# Patient Record
Sex: Male | Born: 1940 | Race: Black or African American | Hispanic: No | Marital: Single
Health system: Southern US, Community
[De-identification: ages and names within clinical notes are randomized; demographics above are authoritative.]

## PROBLEM LIST (undated history)

## (undated) DIAGNOSIS — K635 Polyp of colon: Secondary | ICD-10-CM

## (undated) DIAGNOSIS — T7840XA Allergy, unspecified, initial encounter: Secondary | ICD-10-CM

## (undated) DIAGNOSIS — L281 Prurigo nodularis: Secondary | ICD-10-CM

## (undated) DIAGNOSIS — I6529 Occlusion and stenosis of unspecified carotid artery: Secondary | ICD-10-CM

## (undated) DIAGNOSIS — N400 Enlarged prostate without lower urinary tract symptoms: Secondary | ICD-10-CM

## (undated) DIAGNOSIS — I4510 Unspecified right bundle-branch block: Secondary | ICD-10-CM

## (undated) DIAGNOSIS — K219 Gastro-esophageal reflux disease without esophagitis: Secondary | ICD-10-CM

## (undated) DIAGNOSIS — D649 Anemia, unspecified: Secondary | ICD-10-CM

## (undated) DIAGNOSIS — J45909 Unspecified asthma, uncomplicated: Secondary | ICD-10-CM

## (undated) DIAGNOSIS — D472 Monoclonal gammopathy: Secondary | ICD-10-CM

## (undated) HISTORY — DX: Anemia, unspecified: D64.9

## (undated) HISTORY — DX: Gastro-esophageal reflux disease without esophagitis: K21.9

## (undated) HISTORY — DX: Unspecified asthma, uncomplicated: J45.909

## (undated) HISTORY — DX: Polyp of colon: K63.5

## (undated) HISTORY — PX: TONSILLECTOMY: SUR1361

## (undated) HISTORY — DX: Allergy, unspecified, initial encounter: T78.40XA

---

## 2005-01-09 ENCOUNTER — Ambulatory Visit: Payer: Self-pay | Admitting: Gastroenterology

## 2005-01-17 ENCOUNTER — Ambulatory Visit: Payer: Self-pay | Admitting: Gastroenterology

## 2009-10-09 ENCOUNTER — Encounter (INDEPENDENT_AMBULATORY_CARE_PROVIDER_SITE_OTHER): Payer: Self-pay | Admitting: *Deleted

## 2010-01-01 ENCOUNTER — Encounter (INDEPENDENT_AMBULATORY_CARE_PROVIDER_SITE_OTHER): Payer: Self-pay | Admitting: *Deleted

## 2010-01-10 ENCOUNTER — Encounter (INDEPENDENT_AMBULATORY_CARE_PROVIDER_SITE_OTHER): Payer: Self-pay | Admitting: *Deleted

## 2010-01-11 ENCOUNTER — Ambulatory Visit: Payer: Self-pay | Admitting: Gastroenterology

## 2010-01-18 ENCOUNTER — Ambulatory Visit: Payer: Self-pay | Admitting: Gastroenterology

## 2010-11-26 NOTE — Miscellaneous (Signed)
Summary: previsit/rm  Clinical Lists Changes  Medications: Added new medication of MOVIPREP 100 GM  SOLR (PEG-KCL-NACL-NASULF-NA ASC-C) As per prep instructions. - Signed Rx of MOVIPREP 100 GM  SOLR (PEG-KCL-NACL-NASULF-NA ASC-C) As per prep instructions.;  #1 x 0;  Signed;  Entered by: Sherren Kerns RN;  Authorized by: Mardella Layman MD [facility];  Method used: Print then Give to Patient Observations: Added new observation of NKA: T (01/11/2010 10:29)    Prescriptions: MOVIPREP 100 GM  SOLR (PEG-KCL-NACL-NASULF-NA ASC-C) As per prep instructions.  #1 x 0   Entered by:   Sherren Kerns RN   Authorized by:   Mardella Layman MD [facility]   Signed by:   Sherren Kerns RN on 01/11/2010   Method used:   Print then Give to Patient   RxID:   [id number]

## 2010-11-26 NOTE — Letter (Signed)
Summary: Moviprep Instructions  [facility]  [address]   [place], [place] [address]   Phone: [phone]  Fax: [phone]       Adam Flores    1941/05/08    MRN: [id number]        Procedure Day Dorna Bloom: 01-18-10, Friday     Arrival Time: 10:00 a.m.      Procedure Time: 11:00 a.m.     Location of Procedure:                     x  [facility] (4th Floor)  PREPARATION FOR COLONOSCOPY WITH MOVIPREP   Starting 5 days prior to your procedure 01-13-10  do not eat nuts, seeds, popcorn, corn, beans, peas,  salads, or any raw vegetables.  Do not take any fiber supplements (e.g. Metamucil, Citrucel, and Benefiber).  THE DAY BEFORE YOUR PROCEDURE         DATE: 01-17-10   DAY: Thursday  1.  Drink clear liquids the entire day-NO SOLID FOOD  2.  Do not drink anything colored red or purple.  Avoid juices with pulp.  No orange juice.  3.  Drink at least 64 oz. (8 glasses) of fluid/clear liquids during the day to prevent dehydration and help the prep work efficiently.  CLEAR LIQUIDS INCLUDE: Water Jello Ice Popsicles Tea (sugar ok, no milk/cream) Powdered fruit flavored drinks Coffee (sugar ok, no milk/cream) Gatorade Juice: apple, white grape, white cranberry  Lemonade Clear bullion, consomm, broth Carbonated beverages (any kind) Strained chicken noodle soup Hard Candy                             4.  In the morning, mix first dose of MoviPrep solution:    Empty 1 Pouch A and 1 Pouch B into the disposable container    Add lukewarm drinking water to the top line of the container. Mix to dissolve    Refrigerate (mixed solution should be used within 24 hrs)  5.  Begin drinking the prep at 5:00 p.m. The MoviPrep container is divided by 4 marks.   Every 15 minutes drink the solution down to the next mark (approximately 8 oz) until the full liter is complete.   6.  Follow completed prep with 16 oz of clear liquid of your choice (Nothing red or  purple).  Continue to drink clear liquids until bedtime.  7.  Before going to bed, mix second dose of MoviPrep solution:    Empty 1 Pouch A and 1 Pouch B into the disposable container    Add lukewarm drinking water to the top line of the container. Mix to dissolve       Refrigerate THE DAY OF YOUR PROCEDURE      DATE: 01-18-10   DAY: Friday  Beginning at 6:00 a.m. (5 hours before procedure):         1. Every 15 minutes, drink the solution down to the next mark (approx 8 oz) until the full liter is complete.  2. Follow completed prep with 16 oz. of clear liquid of your choice.    3. You may drink clear liquids until 9:00 a.m.   (2 HOURS BEFORE PROCEDURE).   MEDICATION INSTRUCTIONS  Unless otherwise instructed, you should take regular prescription medications with a small sip of water   as early as possible the morning of your procedure.   Additional medication instructions:  n/a  OTHER INSTRUCTIONS  You will need a responsible adult at least 69 years of age to accompany you and drive you home.   This person must remain in the waiting room during your procedure.  Wear loose fitting clothing that is easily removed.  Leave jewelry and other valuables at home.  However, you may wish to bring a book to read or  an iPod/MP3 player to listen to music as you wait for your procedure to start.  Remove all body piercing jewelry and leave at home.  Total time from sign-in until discharge is approximately 2-3 hours.  You should go home directly after your procedure and rest.  You can resume normal activities the  day after your procedure.  The day of your procedure you should not:   Drive   Make legal decisions   Operate machinery   Drink alcohol   Return to work  You will receive specific instructions about eating, activities and medications before you leave.    The above instructions have been reviewed and explained to me by  Sherren Kerns RN  January 11, 2010  10:49 AM      I fully understand and can verbalize these instructions _____________________________ Date _________

## 2010-11-26 NOTE — Procedures (Signed)
Summary: Colonoscopy  Patient: Adam Flores Note: All result statuses are Final unless otherwise noted.  Tests: (1) Colonoscopy (COL)   COL Colonoscopy           DONE     [facility]     [address]     [place], [place]  [address]           COLONOSCOPY PROCEDURE REPORT           PATIENT:  Adam Flores, Adam Flores  MR#:  [id number]     BIRTHDATE:  January 21, 1941, 68 yrs. old  GENDER:  male     ENDOSCOPIST:  Vania Rea. Jarold Motto, MD, [facility]     REF. BY:     PROCEDURE DATE:  01/18/2010     PROCEDURE:  Average-risk screening colonoscopy     G0121     ASA CLASS:  Class II     INDICATIONS:  Routine Risk Screening, history of polyps     MEDICATIONS:   Fentanyl 50 mcg IV, Versed 4 mg IV           DESCRIPTION OF PROCEDURE:   After the risks benefits and     alternatives of the procedure were thoroughly explained, informed     consent was obtained.  Digital rectal exam was performed and     revealed no abnormalities.   The LB CF-[id number] endoscope     was introduced through the anus and advanced to the cecum, which     was identified by both the appendix and ileocecal valve, without     limitations.  The quality of the prep was excellent, using     MoviPrep.  The instrument was then slowly withdrawn as the colon     was fully examined.     <<PROCEDUREIMAGES>>           FINDINGS:  No polyps or cancers were seen.  This was otherwise a     normal examination of the colon.  Internal hemorrhoids were found.     Retroflexed views in the rectum revealed no abnormalities.    The     scope was then withdrawn from the patient and the procedure     completed.           COMPLICATIONS:  None     ENDOSCOPIC IMPRESSION:     1) No polyps or cancers     2) Otherwise normal examination     3) Internal hemorrhoids     RECOMMENDATIONS:     1) Follow up colonoscopy in 5 years     REPEAT EXAM:  No           ______________________________     Vania Rea. Jarold Motto, MD, [facility]           CC:          n.     eSIGNED:   Vania Rea. Patterson at 01/18/2010 11:45 AM           Jenita Seashore, [id number]  Note: An exclamation mark (!) indicates a result that was not dispersed into the flowsheet. Document Creation Date: 01/18/2010 12:51 PM _______________________________________________________________________  (1) Order result status: Final Collection or observation date-time: 01/18/2010 11:40 Requested date-time:  Receipt date-time:  Reported date-time:  Referring Physician:   Ordering Physician: Sheryn Bison [phone]) Specimen Source:  Source: Launa Grill Order Number: [id number] Lab site:   Appended Document: Colonoscopy    Clinical Lists Changes  Observations: Added new observation of COLONNXTDUE: 12/2014 (01/18/2010 13:38)

## 2010-11-26 NOTE — Letter (Signed)
Summary: Previsit letter  [facility]  [address] [place], [place] [address]   Phone: [phone]  Fax: [phone]       01/01/2010 MRN: [id number]  Adam Flores [address] [place], [place]  [address]  Dear [name],  Welcome to the Gastroenterology Division at [facility].    You are scheduled to see a nurse for your pre-procedure visit on 01/11/2010 at 10:30AM on the 3rd floor at [facility], [address]  We ask that you try to arrive at our office 15 minutes prior to your appointment time to allow for check-in.  Your nurse visit will consist of discussing your medical and surgical history, your immediate family medical history, and your medications.    Please bring a complete list of all your medications or, if you prefer, bring the medication bottles and we will list them.  We will need to be aware of both prescribed and over the counter drugs.  We will need to know exact dosage information as well.  If you are on blood thinners (Coumadin, Plavix, Aggrenox, Ticlid, etc.) please call our office today/prior to your appointment, as we need to consult with your physician about holding your medication.   Please be prepared to read and sign documents such as consent forms, a financial agreement, and acknowledgement forms.  If necessary, and with your consent, a friend or relative is welcome to sit-in on the nurse visit with you.  Please bring your insurance card so that we may make a copy of it.  If your insurance requires a referral to see a specialist, please bring your referral form from your primary care physician.  No co-pay is required for this nurse visit.     If you cannot keep your appointment, please call [phone] to cancel or reschedule prior to your appointment date.  This allows [place] the opportunity to schedule an appointment for another patient in need of care.    Thank you for choosing [facility] for your medical  needs.  We appreciate the opportunity to care for you.  Please visit [place] at our website  to learn more about our practice.                     Sincerely.                                                                                                                   The Gastroenterology Division

## 2014-07-13 ENCOUNTER — Encounter: Payer: Self-pay | Admitting: Gastroenterology

## 2015-02-06 ENCOUNTER — Encounter: Payer: Self-pay | Admitting: Gastroenterology

## 2016-05-02 ENCOUNTER — Ambulatory Visit (INDEPENDENT_AMBULATORY_CARE_PROVIDER_SITE_OTHER): Payer: Federal, State, Local not specified - PPO | Admitting: Gastroenterology

## 2016-05-02 ENCOUNTER — Encounter: Payer: Self-pay | Admitting: Gastroenterology

## 2016-05-02 VITALS — BP 116/80 | HR 72 | Ht 66.0 in | Wt 142.4 lb

## 2016-05-02 DIAGNOSIS — Z86010 Personal history of colonic polyps: Secondary | ICD-10-CM

## 2016-05-02 DIAGNOSIS — K625 Hemorrhage of anus and rectum: Secondary | ICD-10-CM | POA: Diagnosis not present

## 2016-05-02 MED ORDER — NA SULFATE-K SULFATE-MG SULF 17.5-3.13-1.6 GM/177ML PO SOLN: 1.0000 | Freq: Once | ORAL | Status: DC

## 2016-05-02 NOTE — Patient Instructions (Signed)

## 2016-05-02 NOTE — Progress Notes (Signed)
HPI: [name] is a 75 year old African-American male who returns to our clinic today for consultation of a colonoscopy. Per review of our records previous colonoscopies were as below:   Colonoscopy Dr. Verl Blalock 2001 for "family history of polyps" found internal hemorrhoids and a 5 millimeter polyp. I cannot tell if he removed this but there are no pathology reports available at the time of this visit. Colonoscopy Dr. Verl Blalock 2006 for history of adenomatous polyps. No polyps were found. He recommended repeat colonoscopy at five-year interval Colonoscopy Dr. Verl Blalock 2011 for history of adenomatous polyps. No polyps were found. He recommended repeat colonoscopy again at five-year interval.  Today, the patient tells me that he is experiencing bright red blood per rectum which he sees on the toilet paper after wiping 3-4 times per year, typically when he is constipated or straining to have a bowel movement. He also describes that his history of polyps makes him "nervous and he doesn't want to mess around".   He does describe some very infrequent reflux for which he takes Tums, but denies other GI complaints.  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  Patient denies nausea, vomiting, heartburn, abdominal pain, change in bowel habits, melena or weight loss.   Past Medical History  Diagnosis Date  . Asthma   . Colon polyps     Past Surgical History  Procedure Laterality Date  . Tonsillectomy      Current Outpatient Prescriptions  Medication Sig Dispense Refill  . albuterol (PROVENTIL HFA) 108 (90 Base) MCG/ACT inhaler Take 1 puff by mouth as needed.    Marland Kitchen aspirin (GOODSENSE ASPIRIN) 81 MG chewable tablet Chew 1 tablet by mouth. Mon, Wed, Fri    . budesonide-formoterol (SYMBICORT) 80-4.5 MCG/ACT inhaler Inhale 1 puff into the lungs daily.    . clobetasol ointment (TEMOVATE) [id number] % Apply 1 application  topically as needed.    . methotrexate (RHEUMATREX) 2.5 MG tablet Take 1 tablet by mouth once a week.    . Multiple Vitamins-Minerals (MULTIVITAL PO) Take 1 tablet by mouth daily.    . Omega-3 1000 MG CAPS Take 1 tablet by mouth daily.    . folic acid (FOLVITE) 1 MG tablet Take 1 tablet by mouth daily.     No current facility-administered medications for this visit.    Allergies as of 05/02/2016  . (No Known Allergies)    Family History  Problem Relation Age of Onset  . Stroke Father   . Hypertension Sister     Social History   Social History  . Marital Status: Married    Spouse Name: N/A  . Number of Children: 0  . Years of Education: N/A   Occupational History  . radiologist    Social History Main Topics  . Smoking status: Never Smoker   . Smokeless tobacco: Never Used  . Alcohol Use: 0.0 oz/week    0 Standard drinks or equivalent per week     Comment: social  . Drug Use: No  . Sexual Activity: Not on file   Other Topics Concern  . Not on file   Social History Narrative  . No narrative on file    Review of Systems:    Constitutional: No weight loss, fever, chills, weakness or fatigue HEENT: Eyes: No Change in vision               Ears, Nose, Throat:  No Change in hearing Cardiovascular:  No chest painOr palpitations   Respiratory: No SOB Gastrointestinal: See HPI and otherwise negative Genitourinary: No Change in urinary frequency Neurological: No Dizziness or syncope Musculoskeletal: NoNew muscle or back pain Hematologic: Rectal bleeding as in history of present illness Psychiatric: No history of depression or anxiety    Physical Exam:  Vital signs: BP 116/80 mmHg  Pulse 72  Ht 5\' 6"  (1.676 m)  Wt 142 lb 6 oz (64.581 kg)  BMI 22.99 kg/m2  General:   Pleasant African-American male appears to be in NAD, Well developed, Well nourished, alert and cooperative Head:  Normocephalic and atraumatic. Eyes:   PEERL, EOMI. No icterus. Conjunctiva  pink. Ears:  Normal auditory acuity. Neck:  Supple Throat: Oral cavity and pharynx without inflammation, swelling or lesion. Lungs: Respirations even and unlabored. Lungs clear to auscultation bilaterally.   No wheezes, crackles, or rhonchi.  Heart: Normal S1, S2. No MRG. Regular rate and rhythm. No peripheral edema, cyanosis or pallor.  Abdomen:  Soft, nondistended, nontender. No rebound or guarding. Normal bowel sounds. No appreciable masses or hepatomegaly. Rectal:  Not performed.  Msk:  Symmetrical without gross deformities. Extremities:  Without edema, no deformity or joint abnormality.  Neurologic:  Alert and  oriented x4;  grossly normal neurologically.  Skin:   Dry and intact without significant lesions or rashes. Psychiatric: Oriented to person, place and time. Demonstrates good judgement and reason without abnormal affect or behaviors.  Assessment: 1. Rectal bleeding: Patient describes 3-4 episodes a year of bright red blood on the toilet paper; consider hemorrhoids versus AVM versus diverticular bleed versus cancer 2. History of polyps: See history of present illness, first colonoscopy in 2001 had finding of small polyp, pathology unavailable  Plan: 1. Recommend the patient proceed with colonoscopy for further evaluation of rectal bleeding. Risks, benefits, limitations and alternatives of this procedure were discussed and the patient agrees to proceed. 2. Continue Tums when necessary for infrequent reflux symptoms 3. Patient to follow in clinic as directed after time of colonoscopy  Ellouise Newer, PA-C [facility] 05/02/2016, 9:43 AM   ________________________________________________________________________  Velora Heckler GI MD note:  I personally examined the patient, reviewed the data and agree with the assessment and plan described above.  He understands that polyp surveillance guidelines have changed in the past few years.  I recommended colonoscopy for his minor  rectal bleeding which I doubt is anything serious.   Owens Loffler, MD [facility] Pager [phone]

## 2016-06-27 ENCOUNTER — Encounter: Payer: Self-pay | Admitting: Gastroenterology

## 2016-06-27 ENCOUNTER — Ambulatory Visit (AMBULATORY_SURGERY_CENTER): Payer: Federal, State, Local not specified - PPO | Admitting: Gastroenterology

## 2016-06-27 VITALS — BP 118/80 | HR 67 | Temp 98.0°F | Resp 21 | Ht 66.0 in | Wt 142.0 lb

## 2016-06-27 DIAGNOSIS — D123 Benign neoplasm of transverse colon: Secondary | ICD-10-CM

## 2016-06-27 DIAGNOSIS — Z86010 Personal history of colonic polyps: Secondary | ICD-10-CM

## 2016-06-27 DIAGNOSIS — K625 Hemorrhage of anus and rectum: Secondary | ICD-10-CM

## 2016-06-27 DIAGNOSIS — K649 Unspecified hemorrhoids: Secondary | ICD-10-CM | POA: Diagnosis not present

## 2016-06-27 MED ORDER — SODIUM CHLORIDE 0.9 % IV SOLN: 500.0000 mL | INTRAVENOUS | Status: DC

## 2016-06-27 NOTE — Patient Instructions (Signed)
Colon polyps x 2 removed today. Handouts given on polyps, hemorrhoids. Result letter in your mail in 2-3 weeks. Resume current medications. Call us with any questions or concerns. Thank you!!   YOU HAD AN ENDOSCOPIC PROCEDURE TODAY AT [facility]:   Refer to the procedure report that was given to you for any specific questions about what was found during the examination.  If the procedure report does not answer your questions, please call your gastroenterologist to clarify.  If you requested that your care partner not be given the details of your procedure findings, then the procedure report has been included in a sealed envelope for you to review at your convenience later.  YOU SHOULD EXPECT: Some feelings of bloating in the abdomen. Passage of more gas than usual.  Walking can help get rid of the air that was put into your GI tract during the procedure and reduce the bloating. If you had a lower endoscopy (such as a colonoscopy or flexible sigmoidoscopy) you may notice spotting of blood in your stool or on the toilet paper. If you underwent a bowel prep for your procedure, you may not have a normal bowel movement for a few days.  Please Note:  You might notice some irritation and congestion in your nose or some drainage.  This is from the oxygen used during your procedure.  There is no need for concern and it should clear up in a day or so.  SYMPTOMS TO REPORT IMMEDIATELY:   Following lower endoscopy (colonoscopy or flexible sigmoidoscopy):  Excessive amounts of blood in the stool  Significant tenderness or worsening of abdominal pains  Swelling of the abdomen that is new, acute  Fever of 100F or higher   For urgent or emergent issues, a gastroenterologist can be reached at any hour by calling [phone].   DIET:  We do recommend a small meal at first, but then you may proceed to your regular diet.  Drink plenty of fluids but you should avoid alcoholic beverages for 24  hours.  ACTIVITY:  You should plan to take it easy for the rest of today and you should NOT DRIVE or use heavy machinery until tomorrow (because of the sedation medicines used during the test).    FOLLOW UP: Our staff will call the number listed on your records the next business day following your procedure to check on you and address any questions or concerns that you may have regarding the information given to you following your procedure. If we do not reach you, we will leave a message.  However, if you are feeling well and you are not experiencing any problems, there is no need to return our call.  We will assume that you have returned to your regular daily activities without incident.  If any biopsies were taken you will be contacted by phone or by letter within the next 1-3 weeks.  Please call us at [phone] if you have not heard about the biopsies in 3 weeks.    SIGNATURES/CONFIDENTIALITY: You and/or your care partner have signed paperwork which will be entered into your electronic medical record.  These signatures attest to the fact that that the information above on your After Visit Summary has been reviewed and is understood.  Full responsibility of the confidentiality of this discharge information lies with you and/or your care-partner.

## 2016-06-27 NOTE — Op Note (Signed)
[facility] Patient Name: Adam Flores Procedure Date: 06/27/2016 10:32 AM MRN: [id number] Endoscopist: Milus Banister , MD Age: 75 Referring MD:  Date of Birth: Feb 15, 1941 Gender: Male Account #: [id number] Procedure:                Colonoscopy Indications:              Rectal bleeding Colonoscopy Dr. Verl Blalock                            2085for "family history of polyps" found internal                            hemorrhoids and a 5 millimeter polyp. I cannot tell                            if he removed this but there are no pathology                            reports available at the time of this visit.                            Colonoscopy Dr. Verl Blalock 2027for history of                            adenomatous polyps. No polyps were found. He                            recommended repeat colonoscopy at five-year                            interval Colonoscopy Dr. Verl Blalock 2047for                            history of adenomatous polyps. No polyps were                            found. He recommended repeat colonoscopy again at                            five-year interval. Medicines:                Monitored Anesthesia Care Procedure:                Pre-Anesthesia Assessment:                           - Prior to the procedure, a History and Physical                            was performed, and patient medications and                            allergies were reviewed. The patient's tolerance of  previous anesthesia was also reviewed. The risks                            and benefits of the procedure and the sedation                            options and risks were discussed with the patient.                            All questions were answered, and informed consent                            was obtained. Prior Anticoagulants: The patient has                            taken no previous anticoagulant or  antiplatelet                            agents. ASA Grade Assessment: II - A patient with                            mild systemic disease. After reviewing the risks                            and benefits, the patient was deemed in                            satisfactory condition to undergo the procedure.                           After obtaining informed consent, the colonoscope                            was passed under direct vision. Throughout the                            procedure, the patient's blood pressure, pulse, and                            oxygen saturations were monitored continuously. The                            Model CF-HQ190L [id number]) scope was introduced                            through the anus and advanced to the the cecum,                            identified by appendiceal orifice and ileocecal                            valve. The colonoscopy was performed without  difficulty. The patient tolerated the procedure                            well. The quality of the bowel preparation was                            excellent. The ileocecal valve, appendiceal                            orifice, and rectum were photographed. Scope In: 10:41:48 AM Scope Out: 10:55:06 AM Scope Withdrawal Time: 0 hours 11 minutes 3 seconds  Total Procedure Duration: 0 hours 13 minutes 18 seconds  Findings:                 Two sessile polyps were found in the proximal                            transverse colon. The polyps were 2 to 6 mm in                            size. These polyps were removed with a cold snare.                            Resection and retrieval were complete.                           Small internal hemorrhoids                           The exam was otherwise without abnormality on                            direct and retroflexion views. Complications:            No immediate complications. Estimated blood loss:                             None. Estimated Blood Loss:     Estimated blood loss: none. Impression:               - Two 2 to 6 mm polyps in the proximal transverse                            colon, removed with a cold snare. Resected and                            retrieved.                           - Small internal hemorrhoids                           - The examination was otherwise normal on direct                            and retroflexion views. Recommendation:           -  Patient has a contact number available for                            emergencies. The signs and symptoms of potential                            delayed complications were discussed with the                            patient. Return to normal activities tomorrow.                            Written discharge instructions were provided to the                            patient.                           - Resume previous diet.                           - Continue present medications.                           You will receive a letter within 2-3 weeks with the                            pathology results and my final recommendations.                           If the polyp(s) is proven to be 'pre-cancerous' on                            pathology, you will need repeat colonoscopy in 5                            years. If the polyp(s) is NOT 'precancerous' on                            pathology then you should repeat colon cancer                            screening in 10 years with colonoscopy without need                            for colon cancer screening by any method prior to                            then (including stool testing). Milus Banister, MD 06/27/2016 10:58:29 AM This report has been signed electronically.

## 2016-06-27 NOTE — Progress Notes (Signed)
During tranfer from admitting to Rm 2  IV disconnected from IV bag, sterility was maintained, IV catheter remained intact.

## 2016-06-27 NOTE — Progress Notes (Signed)
Called to room to assist during endoscopic procedure.  Patient ID and intended procedure confirmed with present staff. Received instructions for my participation in the procedure from the performing physician.  

## 2016-06-27 NOTE — Progress Notes (Signed)
A/ox3 pleased with MAC, report to Robbin RN 

## 2016-07-01 ENCOUNTER — Telehealth: Payer: Self-pay

## 2016-07-01 NOTE — Telephone Encounter (Signed)
  Follow up Call-  Call back number 06/27/2016  Post procedure Call Back phone  # [phone]  Permission to leave phone message Yes  Some recent data might be hidden     Patient questions:  Do you have a fever, pain , or abdominal swelling? No. Pain Score  0 *  Have you tolerated food without any problems? Yes.    Have you been able to return to your normal activities? Yes.    Do you have any questions about your discharge instructions: Diet   No. Medications  No. Follow up visit  No.  Do you have questions or concerns about your Care? No.  Actions: * If pain score is 4 or above: No action needed, pain <4.

## 2016-07-04 ENCOUNTER — Encounter: Payer: Self-pay | Admitting: Gastroenterology

## 2019-11-26 ENCOUNTER — Ambulatory Visit: Payer: Federal, State, Local not specified - PPO

## 2020-07-03 ENCOUNTER — Ambulatory Visit: Payer: Self-pay | Attending: Internal Medicine

## 2020-07-03 DIAGNOSIS — Z23 Encounter for immunization: Secondary | ICD-10-CM

## 2020-07-03 NOTE — Progress Notes (Signed)
   Covid-19 Vaccination Clinic  Name:  Adam Flores    MRN: [id number] DOB: 1941-10-21  07/03/2020  [name] was observed post Covid-19 immunization for 15 minutes without incident. He was provided with Vaccine Information Sheet and instruction to access the V-Safe system.   [name] was instructed to call 911 with any severe reactions post vaccine: Marland Kitchen Difficulty breathing  . Swelling of face and throat  . A fast heartbeat  . A bad rash all over body  . Dizziness and weakness

## 2021-02-19 ENCOUNTER — Ambulatory Visit: Payer: Federal, State, Local not specified - PPO

## 2021-04-15 ENCOUNTER — Encounter: Payer: Self-pay | Admitting: Gastroenterology

## 2021-06-07 ENCOUNTER — Telehealth: Payer: Self-pay | Admitting: *Deleted

## 2021-06-07 NOTE — Telephone Encounter (Signed)
error 

## 2021-07-10 ENCOUNTER — Encounter: Payer: Federal, State, Local not specified - PPO | Admitting: Gastroenterology

## 2021-07-11 ENCOUNTER — Ambulatory Visit (INDEPENDENT_AMBULATORY_CARE_PROVIDER_SITE_OTHER): Payer: Medicare Other | Admitting: Gastroenterology

## 2021-07-11 ENCOUNTER — Encounter: Payer: Self-pay | Admitting: Gastroenterology

## 2021-07-11 VITALS — BP 110/70 | HR 68 | Ht 66.0 in | Wt 149.1 lb

## 2021-07-11 DIAGNOSIS — Z86010 Personal history of colonic polyps: Secondary | ICD-10-CM

## 2021-07-11 MED ORDER — NA SULFATE-K SULFATE-MG SULF 17.5-3.13-1.6 GM/177ML PO SOLN: 1.0000 | Freq: Once | ORAL | 0 refills | Status: AC

## 2021-07-11 NOTE — Progress Notes (Signed)
07/11/2021 Adam Flores [id number] [birth year]   HISTORY OF PRESENT ILLNESS: This is a pleasant 80 year old male who is a retired [employer].  He is a patient of Dr. Ardis Hughs.  He is here today to discuss and schedule colonoscopy.  His last was in September 2017 as listed below.  He has very limited past medical history, on very few medications.  He does not have any GI complaints today.  Says that he feels well.  Moves his bowels well.  No rectal bleeding.  Appetite is good without unintentional weight loss.  Colonoscopy 06/2016 showed the following:  - Two 2 to 6 mm polyps in the proximal transverse colon, removed with a cold snare. Resected and retrieved. - Small internal hemorrhoids - The examination was otherwise normal on direct and retroflexion views.  Surgical [P], transverse, polyp (2) - TUBULAR ADENOMA, TWO FRAGMENTS. - NO HIGH GRADE DYSPLASIA OR MALIGNANCY IDENTIFIED.   Past Medical History:  Diagnosis Date   Asthma    Colon polyps    Past Surgical History:  Procedure Laterality Date   TONSILLECTOMY      reports that she has never smoked. She has never used smokeless tobacco. She reports current alcohol use. She reports that she does not use drugs. family history includes Congestive Heart Failure in her mother; Hypertension in her sister; Prostate cancer in her brother; Stroke in her father. No Known Allergies    Outpatient Encounter Medications as of 07/11/2021  Medication Sig   albuterol (VENTOLIN HFA) 108 (90 Base) MCG/ACT inhaler Take 1 puff by mouth as needed.   aspirin 81 MG chewable tablet Chew 1 tablet by mouth. Mon, Wed, Fri   CALCIUM-MAGNESIUM PO Take 1 tablet by mouth daily.   Cholecalciferol (VITAMIN D3 PO) Take 1 tablet by mouth daily.   clobetasol ointment (TEMOVATE) [id number] % Apply 1 application topically as needed.   Docusate Sodium (DSS) 100 MG CAPS TAKE ONE CAPSULE BY MOUTH TWICE A DAY AS NEEDED   doxycycline (MONODOX) 100 MG capsule Take 100 mg  by mouth as needed.   fluticasone (FLONASE) 50 MCG/ACT nasal spray Place 1 spray into both nostrils daily.   fluticasone-salmeterol (ADVAIR) 100-50 MCG/ACT AEPB INHALE 1 INHALATION BY MOUTH TWICE A DAY (RINSE MOUTH WELL WITH WATER AFTER EACH USE (CONVERTED FROM SYMBICORT. NOTE NEW DIRECTIONS)   folic acid (FOLVITE) 1 MG tablet Take 1 tablet by mouth daily.   methotrexate (RHEUMATREX) 2.5 MG tablet Take 1 tablet by mouth once a week.   Multiple Vitamins-Minerals (CENTRUM SILVER 50+MEN PO) Take 1 capsule by mouth daily.   Omega-3 1000 MG CAPS Take 1 tablet by mouth daily.   POTASSIUM CHLORIDE PO TAKE ?DOSE BY MOUTH DAILY   Saw Palmetto, Serenoa repens, (SAW PALMETTO PO) TAKE 1 CAP/TAB BY MOUTH DAILY   triamcinolone ointment (KENALOG) 0.1 % APPLY SMALL AMOUNT TO AFFECTED AREAS TO AFFECTED AREA TWICE A DAY AS NEEDED DURING THE WEEK   [DISCONTINUED] budesonide-formoterol (SYMBICORT) 80-4.5 MCG/ACT inhaler Inhale 1 puff into the lungs daily.   [DISCONTINUED] Multiple Vitamins-Minerals (MULTIVITAL PO) Take 1 tablet by mouth daily.   Facility-Administered Encounter Medications as of 07/11/2021  Medication   0.9 %  sodium chloride infusion     REVIEW OF SYSTEMS  : All other systems reviewed and negative except where noted in the History of Present Illness.   PHYSICAL EXAM: BP 110/70 (BP Location: Left Arm, Patient Position: Sitting, Cuff Size: Normal)   Pulse 68   Ht '5\' 6"'$  (1.676 m) Comment:  height measured without shoes  Wt 149 lb 2 oz (67.6 kg)   BMI 24.07 kg/m  General: Well developed AA male in no acute distress Head: Normocephalic and atraumatic Eyes:  Sclerae anicteric, conjunctiva pink. Ears: Normal auditory acuity Lungs: Clear throughout to auscultation; no W/R/R. Heart: Regular rate and rhythm; no M/R/G. Abdomen: Soft, non-distended.  BS present.  Non-tender. Rectal:  Will be done at the time of colonoscopy. Musculoskeletal: Symmetrical with no gross deformities  Skin: No lesions  on visible extremities Extremities: No edema  Neurological: Alert oriented x 4, grossly non-focal Psychological:  Alert and cooperative. Normal mood and affect  ASSESSMENT AND PLAN: *Personal history of colon polyps: Had a couple of adenomatous colon polyps removed from colonoscopy in 2017.  Is here to schedule repeat procedure.  I think he is appropriate to proceed with colonoscopy.  We will schedule Dr. Ardis Hughs.  The risks, benefits, and alternatives to colonoscopy were discussed with the patient and he consents to proceed.    CC:  Borum, Jaci Standard, MD

## 2021-07-11 NOTE — Patient Instructions (Signed)
If you are age 79 or older, your body mass index should be between 23-30. Your Body mass index is 24.07 kg/m. If this is out of the aforementioned range listed, please consider follow up with your Primary Care Provider. __________________________________________________________  The [facility] providers would like to encourage you to use [facility] to communicate with providers for non-urgent requests or questions.  Due to long hold times on the telephone, sending your provider a message by [facility] may be a faster and more efficient way to get a response.  Please allow 48 business hours for a response.  Please remember that this is for non-urgent requests.   You have been scheduled for a colonoscopy. Please follow written instructions given to you at your visit today.  Please pick up your prep supplies at the pharmacy within the next 1-3 days. If you use inhalers (even only as needed), please bring them with you on the day of your procedure.  Follow up pending the results of your Colonoscopy or as needed.  Thank you for entrusting me with your care and choosing [facility].  Alonza Bogus, AP-C

## 2021-07-11 NOTE — Progress Notes (Signed)
Newest surveillance guidelines show that he is probably not 'due' for surveillance for another couple years.  I am happy to proceed with sooner examination as long as he understands that and the fact that there are risks to invasive testing such as colonoscopy.

## 2021-08-07 ENCOUNTER — Other Ambulatory Visit: Payer: Self-pay

## 2021-08-07 ENCOUNTER — Ambulatory Visit (AMBULATORY_SURGERY_CENTER): Payer: Medicare Other | Admitting: Gastroenterology

## 2021-08-07 ENCOUNTER — Encounter: Payer: Self-pay | Admitting: Gastroenterology

## 2021-08-07 VITALS — BP 112/78 | HR 67 | Temp 97.8°F | Resp 19 | Ht 66.0 in | Wt 149.0 lb

## 2021-08-07 DIAGNOSIS — Z86010 Personal history of colonic polyps: Secondary | ICD-10-CM | POA: Diagnosis not present

## 2021-08-07 MED ORDER — SODIUM CHLORIDE 0.9 % IV SOLN: 500.0000 mL | INTRAVENOUS | Status: DC

## 2021-08-07 NOTE — Progress Notes (Signed)
  The recent H&P (dated 07/11/21) was reviewed, the patient was examined and there is no change in the patients condition since that H&P was completed.   Adam Flores  08/07/2021, 1:46 PM

## 2021-08-07 NOTE — Patient Instructions (Signed)
Handout on hemorrhoids given. No repeat colonoscopy due to age.   YOU HAD AN ENDOSCOPIC PROCEDURE TODAY AT [facility]:   Refer to the procedure report that was given to you for any specific questions about what was found during the examination.  If the procedure report does not answer your questions, please call your gastroenterologist to clarify.  If you requested that your care partner not be given the details of your procedure findings, then the procedure report has been included in a sealed envelope for you to review at your convenience later.  YOU SHOULD EXPECT: Some feelings of bloating in the abdomen. Passage of more gas than usual.  Walking can help get rid of the air that was put into your GI tract during the procedure and reduce the bloating. If you had a lower endoscopy (such as a colonoscopy or flexible sigmoidoscopy) you may notice spotting of blood in your stool or on the toilet paper. If you underwent a bowel prep for your procedure, you may not have a normal bowel movement for a few days.  Please Note:  You might notice some irritation and congestion in your nose or some drainage.  This is from the oxygen used during your procedure.  There is no need for concern and it should clear up in a day or so.  SYMPTOMS TO REPORT IMMEDIATELY:  Following lower endoscopy (colonoscopy or flexible sigmoidoscopy):  Excessive amounts of blood in the stool  Significant tenderness or worsening of abdominal pains  Swelling of the abdomen that is new, acute  Fever of 100F or higher  For urgent or emergent issues, a gastroenterologist can be reached at any hour by calling [phone]. Do not use MyChart messaging for urgent concerns.    DIET:  We do recommend a small meal at first, but then you may proceed to your regular diet.  Drink plenty of fluids but you should avoid alcoholic beverages for 24 hours.  ACTIVITY:  You should plan to take it easy for the rest of today and  you should NOT DRIVE or use heavy machinery until tomorrow (because of the sedation medicines used during the test).    FOLLOW UP: Our staff will call the number listed on your records 48-72 hours following your procedure to check on you and address any questions or concerns that you may have regarding the information given to you following your procedure. If we do not reach you, we will leave a message.  We will attempt to reach you two times.  During this call, we will ask if you have developed any symptoms of COVID 19. If you develop any symptoms (ie: fever, flu-like symptoms, shortness of breath, cough etc.) before then, please call [phone].  If you test positive for Covid 19 in the 2 weeks post procedure, please call and report this information to [place].    If any biopsies were taken you will be contacted by phone or by letter within the next 1-3 weeks.  Please call us at [phone] if you have not heard about the biopsies in 3 weeks.    SIGNATURES/CONFIDENTIALITY: You and/or your care partner have signed paperwork which will be entered into your electronic medical record.  These signatures attest to the fact that that the information above on your After Visit Summary has been reviewed and is understood.  Full responsibility of the confidentiality of this discharge information lies with you and/or your care-partner.

## 2021-08-07 NOTE — Progress Notes (Signed)
PT taken to PACU. Monitors in place. VSS. Report given to RN. 

## 2021-08-07 NOTE — Op Note (Signed)
[facility] Patient Name: Adam Flores Procedure Date: 08/07/2021 1:38 PM MRN: [id number] Endoscopist: Milus Banister , MD Age: 80 Referring MD:  Date of Birth: August 07, 1941 Gender: Male Account #: [id number] Procedure:                Colonoscopy Indications:              High risk colon cancer surveillance: Personal                            history of colonic polyps; Last colonoscoyp 2017                            two subCM adenomas removed Medicines:                Monitored Anesthesia Care Procedure:                Pre-Anesthesia Assessment:                           - Prior to the procedure, a History and Physical                            was performed, and patient medications and                            allergies were reviewed. The patient's tolerance of                            previous anesthesia was also reviewed. The risks                            and benefits of the procedure and the sedation                            options and risks were discussed with the patient.                            All questions were answered, and informed consent                            was obtained. Prior Anticoagulants: The patient has                            taken no previous anticoagulant or antiplatelet                            agents. ASA Grade Assessment: II - A patient with                            mild systemic disease. After reviewing the risks                            and benefits, the patient was deemed in  satisfactory condition to undergo the procedure.                           After obtaining informed consent, the colonoscope                            was passed under direct vision. Throughout the                            procedure, the patient's blood pressure, pulse, and                            oxygen saturations were monitored continuously. The                            CF HQ190L #[id number] was introduced  through the anus                            and advanced to the the cecum, identified by                            appendiceal orifice and ileocecal valve. The                            colonoscopy was performed without difficulty. The                            patient tolerated the procedure well. The quality                            of the bowel preparation was good. The ileocecal                            valve, appendiceal orifice, and rectum were                            photographed. Scope In: 1:52:29 PM Scope Out: 2:05:24 PM Scope Withdrawal Time: 0 hours 8 minutes 6 seconds  Total Procedure Duration: 0 hours 12 minutes 55 seconds  Findings:                 Internal hemorrhoids were found. The hemorrhoids                            were medium-sized.                           The exam was otherwise without abnormality on                            direct and retroflexion views. Complications:            No immediate complications. Estimated blood loss:                            None. Estimated Blood Loss:  Estimated blood loss: none. Impression:               - Internal hemorrhoids.                           - The examination was otherwise normal on direct                            and retroflexion views.                           - No polyps or cancers. Recommendation:           - Patient has a contact number available for                            emergencies. The signs and symptoms of potential                            delayed complications were discussed with the                            patient. Return to normal activities tomorrow.                            Written discharge instructions were provided to the                            patient.                           - Resume previous diet.                           - Continue present medications.                           - No repeat colonoscopy due to age. Milus Banister, MD 08/07/2021 2:09:05  PM This report has been signed electronically.

## 2021-08-09 ENCOUNTER — Telehealth: Payer: Self-pay | Admitting: *Deleted

## 2021-08-09 ENCOUNTER — Telehealth: Payer: Self-pay

## 2021-08-09 NOTE — Telephone Encounter (Signed)
First post procedure follow up call, no answer 

## 2021-08-09 NOTE — Telephone Encounter (Signed)
Message left

## 2021-08-18 ENCOUNTER — Emergency Department (HOSPITAL_COMMUNITY)
Admission: EM | Admit: 2021-08-18 | Discharge: 2021-08-18 | Disposition: A | Discharge status: Home / Self Care | Payer: Medicare Other | Attending: Emergency Medicine | Admitting: Emergency Medicine

## 2021-08-18 ENCOUNTER — Other Ambulatory Visit: Payer: Self-pay

## 2021-08-18 DIAGNOSIS — R339 Retention of urine, unspecified: Secondary | ICD-10-CM

## 2021-08-18 DIAGNOSIS — J45909 Unspecified asthma, uncomplicated: Secondary | ICD-10-CM | POA: Insufficient documentation

## 2021-08-18 LAB — URINALYSIS, ROUTINE W REFLEX MICROSCOPIC
Bilirubin Urine: NEGATIVE
Glucose, UA: NEGATIVE mg/dL
Ketones, ur: NEGATIVE mg/dL
Leukocytes,Ua: NEGATIVE
Nitrite: NEGATIVE
Protein, ur: NEGATIVE mg/dL
Specific Gravity, Urine: 1.003 — ABNORMAL LOW (ref 1.005–1.030)
pH: 6 (ref 5.0–8.0)

## 2021-08-18 MED ORDER — LIDOCAINE HCL (CARDIAC) PF 100 MG/5ML IV SOSY
PREFILLED_SYRINGE | INTRAVENOUS | Status: AC
  Filled 2021-08-18: qty 5

## 2021-08-18 NOTE — ED Provider Notes (Signed)
[facility] EMERGENCY DEPARTMENT Provider Note   CSN: [id number] Arrival date & time: 08/18/21  0154     History Chief Complaint  Patient presents with   Urinary Retention    Adam Flores is a 79 y.o. male.  The history is provided by the patient and medical records.   79 y.o. M with hx of asthma, BPH, presenting to the ED for urinary retention.  States he was last able to urinate around 8PM.  States it feels like his bladder is spasming and he cannot relax enough to urinate.  He does have hx of BPH.  Had recent prostate biopsy due to elevated PSA but normal tissue samples.  He is followed by urology.  Past Medical History:  Diagnosis Date   Asthma    Colon polyps     Patient Active Problem List   Diagnosis Date Noted   History of colonic polyps 07/11/2021    Past Surgical History:  Procedure Laterality Date   TONSILLECTOMY         Family History  Problem Relation Age of Onset   Congestive Heart Failure Mother    Stroke Father    Hypertension Sister    Prostate cancer Brother     Social History   Tobacco Use   Smoking status: Never   Smokeless tobacco: Never  Vaping Use   Vaping Use: Never used  Substance Use Topics   Alcohol use: Yes    Alcohol/week: 0.0 standard drinks    Comment: social   Drug use: No    Home Medications Prior to Admission medications   Medication Sig Start Date End Date Taking? Authorizing Provider  albuterol (VENTOLIN HFA) 108 (90 Base) MCG/ACT inhaler Take 1 puff by mouth as needed. 02/24/13   [provider]  aspirin 81 MG chewable tablet Chew 1 tablet by mouth. Mon, Wed, Fri 01/04/08   [provider]  CALCIUM-MAGNESIUM PO Take 1 tablet by mouth daily.    [provider]  Cholecalciferol (VITAMIN D3 PO) Take 1 tablet by mouth daily.    [provider]  clobetasol ointment (TEMOVATE) 5.36 % Apply 1 application topically as needed. Patient not taking: Reported on 08/07/2021  01/04/08   [provider]  Docusate Sodium (DSS) 100 MG CAPS TAKE ONE CAPSULE BY MOUTH TWICE A DAY AS NEEDED 02/20/21 02/21/22  [provider]  doxycycline (MONODOX) 100 MG capsule Take 100 mg by mouth as needed. Patient not taking: Reported on 08/07/2021 07/10/21   [provider]  fluticasone (FLONASE) 50 MCG/ACT nasal spray Place 1 spray into both nostrils daily. Patient not taking: Reported on 08/07/2021 02/26/21   [provider]  fluticasone-salmeterol (ADVAIR) 100-50 MCG/ACT AEPB INHALE 1 INHALATION BY MOUTH TWICE A DAY (RINSE MOUTH WELL WITH WATER AFTER EACH USE (CONVERTED FROM SYMBICORT. NOTE NEW DIRECTIONS) 02/19/21 02/20/22  [provider]  folic acid (FOLVITE) 1 MG tablet Take 1 tablet by mouth daily. 02/23/16   [provider]  methotrexate (RHEUMATREX) 2.5 MG tablet Take 1 tablet by mouth once a week. 01/04/08   [provider]  Multiple Vitamins-Minerals (CENTRUM SILVER 50+MEN PO) Take 1 capsule by mouth daily.    [provider]  Omega-3 1000 MG CAPS Take 1 tablet by mouth daily. 01/04/08   [provider]  POTASSIUM CHLORIDE PO TAKE ?DOSE BY MOUTH DAILY 09/18/15   [provider]  Saw Palmetto, Serenoa repens, (SAW PALMETTO PO) TAKE 1 CAP/TAB BY MOUTH DAILY 02/22/20   [provider]  triamcinolone ointment (KENALOG) 0.1 % APPLY SMALL AMOUNT TO AFFECTED AREAS TO AFFECTED AREA TWICE A DAY AS NEEDED DURING THE WEEK 03/04/17   [provider]    Allergies    Patient has no known allergies.  Review of Systems   Review of Systems  Genitourinary:  Positive for difficulty urinating.  All other systems reviewed and are negative.  Physical Exam Updated Vital Signs BP (!) 162/112 (BP Location: Right Arm)   Pulse (!) 108   Temp 97.8 F (36.6 C) (Oral)   Resp (!) 22   Ht 5\' 6"  (1.676 m)   Wt 63.5 kg   SpO2 98%   BMI 22.60 kg/m   Physical Exam Vitals and nursing note reviewed.   Constitutional:      Appearance: He is well-developed.  HENT:     Head: Normocephalic and atraumatic.  Eyes:     Conjunctiva/sclera: Conjunctivae normal.     Pupils: Pupils are equal, round, and reactive to light.  Cardiovascular:     Rate and Rhythm: Normal rate and regular rhythm.     Heart sounds: Normal heart sounds.  Pulmonary:     Effort: Pulmonary effort is normal. No respiratory distress.     Breath sounds: Normal breath sounds. No rhonchi.  Abdominal:     General: Bowel sounds are normal.     Palpations: Abdomen is soft.  Genitourinary:    Comments: Foley in place, freely draining, 1400cc output thus far Musculoskeletal:        General: Normal range of motion.     Cervical back: Normal range of motion.  Skin:    General: Skin is warm and dry.  Neurological:     Mental Status: He is alert and oriented to person, place, and time.    ED Results / Procedures / Treatments   Labs (all labs ordered are listed, but only abnormal results are displayed) Labs Reviewed  URINALYSIS, ROUTINE W REFLEX MICROSCOPIC - Abnormal; Notable for the following components:      Result Value   Color, Urine STRAW (*)    Specific Gravity, Urine 1.003 (*)    Hgb urine dipstick SMALL (*)    Bacteria, UA RARE (*)    All other components within normal limits    EKG None  Radiology No results found.  Procedures Procedures   Medications Ordered in ED Medications  lidocaine (cardiac) 100 mg/77mL (XYLOCAINE) 100 MG/5ML injection 2% (  Given 08/18/21 0300)    ED Course  I have reviewed the triage vital signs and the nursing notes.  Pertinent labs & imaging results that were available during my care of the patient were reviewed by me and considered in my medical decision making (see chart for details).    MDM Rules/Calculators/A&P                           80 y.o. M here with urinary retention since 8PM.  Hx of BPH with negative prostate biopsy.  Foley inserted, drained out approx  1400cc of clear/yellow urine.  UA without signs of infection.  No further pain/discomfort.  Will leave foley in place, switch to leg bag and have him follow-up with his urologist.  May return here for new concerns.  Final Clinical Impression(s) / ED Diagnoses Final diagnoses:  Urinary retention    Rx / DC Orders ED Discharge Orders     None        Quincy Carnes  Curt Jews 08/18/21 0430    Ripley Fraise, MD 08/18/21 [phone]

## 2021-08-18 NOTE — ED Triage Notes (Signed)
Pt brought in by EMS for difficulty urinating. Pt reports he last urinated 2000.

## 2021-08-18 NOTE — Discharge Instructions (Signed)
We recommend close follow-up with your urologist or local office here in town.  Information given to make appt if needed. Please return here for any new issues-- foley not draining, high fever, etc.

## 2021-08-23 ENCOUNTER — Other Ambulatory Visit: Payer: Self-pay | Admitting: Urology

## 2021-08-23 DIAGNOSIS — R972 Elevated prostate specific antigen [PSA]: Secondary | ICD-10-CM

## 2021-09-15 ENCOUNTER — Ambulatory Visit
Admission: RE | Admit: 2021-09-15 | Discharge: 2021-09-15 | Disposition: A | Discharge status: Home / Self Care | Payer: Medicare Other | Source: Ambulatory Visit | Attending: Urology | Admitting: Urology

## 2021-09-15 ENCOUNTER — Other Ambulatory Visit: Payer: Self-pay

## 2021-09-15 DIAGNOSIS — R972 Elevated prostate specific antigen [PSA]: Secondary | ICD-10-CM

## 2021-09-15 MED ORDER — GADOBENATE DIMEGLUMINE 529 MG/ML IV SOLN
12.0000 mL | Freq: Once | INTRAVENOUS | Status: AC | PRN
  Administered 2021-09-15: 12 mL via INTRAVENOUS

## 2021-10-07 ENCOUNTER — Ambulatory Visit (INDEPENDENT_AMBULATORY_CARE_PROVIDER_SITE_OTHER): Payer: Medicare Other | Admitting: Surgery

## 2021-10-07 ENCOUNTER — Other Ambulatory Visit: Payer: Self-pay

## 2021-10-07 ENCOUNTER — Encounter: Payer: Self-pay | Admitting: Surgery

## 2021-10-07 VITALS — BP 116/79 | HR 93 | Temp 98.0°F | Resp 20 | Ht 66.0 in | Wt 144.0 lb

## 2021-10-07 DIAGNOSIS — I6522 Occlusion and stenosis of left carotid artery: Secondary | ICD-10-CM | POA: Diagnosis not present

## 2021-10-07 DIAGNOSIS — Z419 Encounter for procedure for purposes other than remedying health state, unspecified: Secondary | ICD-10-CM

## 2021-10-07 MED ORDER — ROSUVASTATIN CALCIUM 10 MG PO TABS: 10.0000 mg | ORAL_TABLET | Freq: Every day | ORAL | 12 refills | Status: AC

## 2021-10-07 MED ORDER — CLOPIDOGREL BISULFATE 75 MG PO TABS: 75.0000 mg | ORAL_TABLET | Freq: Every day | ORAL | 11 refills | Status: DC

## 2021-10-07 NOTE — H&P (View-Only) (Signed)
[facility] [place]  Patient name: Adam Flores MRN: [id number] DOB: 06-07-1941 Sex: male   REQUESTING PROVIDER:    Dr. Tamala Julian   REASON FOR CONSULT:    Carotid stenosis  HISTORY OF PRESENT ILLNESS:   Adam Flores is a 80 y.o. male, who is referred for evaluation of left carotid stenosis.  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  This led to a carotid ultrasound that showed a tight left-sided stenosis.  He then had a CT scan confirming this.  He remains asymptomatic.  Specifically he denies numbness or weakness in either extremity.  He denies slurred speech.  He denies amaurosis fugax.  The patient takes aspirin daily.  He is very active.  He runs 4 miles every Monday.  PAST MEDICAL HISTORY    Past Medical History:  Diagnosis Date   Asthma    Colon polyps      FAMILY HISTORY   Family History  Problem Relation Age of Onset   Congestive Heart Failure Mother    Stroke Father    Hypertension Sister    Prostate cancer Brother     SOCIAL HISTORY:   Social History   Socioeconomic History   Marital status: Married    Spouse name: Not on file   Number of children: 0   Years of education: Not on file   Highest education level: Not on file  Occupational History   Occupation: radiologist    Comment: retired  Tobacco Use   Smoking status: Never   Smokeless tobacco: Never  Vaping Use   Vaping Use: Never used  Substance and Sexual Activity   Alcohol use: Yes    Alcohol/week: 0.0 standard drinks    Comment: social   Drug use: No   Sexual activity: Not on file  Other Topics Concern   Not on file  Social History Narrative   Not on file   Social Determinants of Health   Financial Resource Strain: Not on file  Food Insecurity: Not on file  Transportation Needs: Not on file  Physical Activity: Not on file  Stress: Not on file  Social Connections: Not on file   Intimate Partner Violence: Not on file    ALLERGIES:    No Known Allergies  CURRENT MEDICATIONS:    Current Outpatient Medications  Medication Sig Dispense Refill   albuterol (VENTOLIN HFA) 108 (90 Base) MCG/ACT inhaler Take 1 puff by mouth as needed.     aspirin 325 MG EC tablet Take 325 mg by mouth daily.     CALCIUM-MAGNESIUM PO Take 1 tablet by mouth daily.     Cholecalciferol (VITAMIN D3 PO) Take 1 tablet by mouth daily.     clobetasol ointment (TEMOVATE) 5.40 % Apply 1 application topically as needed.     Docusate Sodium (DSS) 100 MG CAPS TAKE ONE CAPSULE BY MOUTH TWICE A DAY AS NEEDED     doxycycline (MONODOX) 100 MG capsule Take 100 mg by mouth as needed.     finasteride (PROSCAR) 5 MG tablet Take 5 mg by mouth daily.     fluticasone (FLONASE) 50 MCG/ACT nasal spray Place 1 spray into both nostrils daily.     fluticasone-salmeterol (ADVAIR) 100-50 MCG/ACT AEPB INHALE 1 INHALATION BY MOUTH TWICE A DAY (RINSE MOUTH WELL WITH WATER AFTER EACH USE (CONVERTED FROM SYMBICORT. NOTE NEW DIRECTIONS)     folic acid (FOLVITE) 1 MG tablet Take 1 tablet by mouth daily.  methotrexate (RHEUMATREX) 2.5 MG tablet Take 1 tablet by mouth once a week.     Multiple Vitamins-Minerals (CENTRUM SILVER 50+MEN PO) Take 1 capsule by mouth daily.     Omega-3 1000 MG CAPS Take 1 tablet by mouth daily.     POTASSIUM CHLORIDE PO TAKE ?DOSE BY MOUTH DAILY     Saw Palmetto, Serenoa repens, (SAW PALMETTO PO) TAKE 1 CAP/TAB BY MOUTH DAILY     triamcinolone ointment (KENALOG) 0.1 % APPLY SMALL AMOUNT TO AFFECTED AREAS TO AFFECTED AREA TWICE A DAY AS NEEDED DURING THE WEEK     No current facility-administered medications for this visit.    REVIEW OF SYSTEMS:   [X]  denotes positive finding, [ ]  denotes negative finding Cardiac  Comments:  Chest pain or chest pressure:    Shortness of breath upon exertion:    Short of breath when lying flat:    Irregular heart rhythm:        Vascular    Pain in  calf, thigh, or hip brought on by ambulation:    Pain in feet at night that wakes you up from your sleep:     Blood clot in your veins:    Leg swelling:         Pulmonary    Oxygen at home:    Productive cough:     Wheezing:         Neurologic    Sudden weakness in arms or legs:     Sudden numbness in arms or legs:     Sudden onset of difficulty speaking or slurred speech:    Temporary loss of vision in one eye:     Problems with dizziness:         Gastrointestinal    Blood in stool:      Vomited blood:         Genitourinary    Burning when urinating:     Blood in urine:        Psychiatric    Major depression:         Hematologic    Bleeding problems:    Problems with blood clotting too easily:        Skin    Rashes or ulcers:        Constitutional    Fever or chills:     PHYSICAL EXAM:   Vitals:   10/07/21 1352 10/07/21 1355  BP: 128/82 116/79  Pulse: 93   Resp: 20   Temp: 98 F (36.7 C)   SpO2: 96%   Weight: 144 lb (65.3 kg)   Height: 5\' 6"  (1.676 m)     GENERAL: The patient is a well-nourished male, in no acute distress. The vital signs are documented above. CARDIAC: There is a regular rate and rhythm.  VASCULAR: Palpable pedal pulses. PULMONARY: Nonlabored respirations ABDOMEN: Soft and non-tender  MUSCULOSKELETAL: There are no major deformities or cyanosis. NEUROLOGIC: No focal weakness or paresthesias are detected. SKIN: There are no ulcers or rashes noted. PSYCHIATRIC: The patient has a normal affect.  STUDIES:   I have reviewed his CT angiogram that shows a high-grade, greater than 80% left-sided stenosis.  His bifurcation is high up near the angle of the mandible.  No significant right-sided stenosis  ASSESSMENT and PLAN   Asymptomatic left carotid stenosis: I discussed that I would recommend repair given the degree of stenosis.  I think he is better suited for TCAR because he has a very high bifurcation.  I discussed  the details of the  operation including the risks and benefits.  We discussed the risk of stroke.  He would like to get this done as soon as possible.  I have placed him on the schedule for Wednesday, December 28.  I am starting him on Plavix and Crestor 10 mg today.  He is going to continue take an 81 mg of aspirin.   [name], MD, FACS [facility] [phone] Pager [phone]

## 2021-10-07 NOTE — Progress Notes (Signed)
[facility] [place]  Patient name: Adam Flores MRN: [id number] DOB: 01-27-1941 Sex: male   REQUESTING PROVIDER:    Dr. Tamala Julian   REASON FOR CONSULT:    Carotid stenosis  HISTORY OF PRESENT ILLNESS:   Adam Flores is a 80 y.o. male, who is referred for evaluation of left carotid stenosis.  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  This led to a carotid ultrasound that showed a tight left-sided stenosis.  He then had a CT scan confirming this.  He remains asymptomatic.  Specifically he denies numbness or weakness in either extremity.  He denies slurred speech.  He denies amaurosis fugax.  The patient takes aspirin daily.  He is very active.  He runs 4 miles every Monday.  PAST MEDICAL HISTORY    Past Medical History:  Diagnosis Date   Asthma    Colon polyps      FAMILY HISTORY   Family History  Problem Relation Age of Onset   Congestive Heart Failure Mother    Stroke Father    Hypertension Sister    Prostate cancer Brother     SOCIAL HISTORY:   Social History   Socioeconomic History   Marital status: Married    Spouse name: Not on file   Number of children: 0   Years of education: Not on file   Highest education level: Not on file  Occupational History   Occupation: radiologist    Comment: retired  Tobacco Use   Smoking status: Never   Smokeless tobacco: Never  Vaping Use   Vaping Use: Never used  Substance and Sexual Activity   Alcohol use: Yes    Alcohol/week: 0.0 standard drinks    Comment: social   Drug use: No   Sexual activity: Not on file  Other Topics Concern   Not on file  Social History Narrative   Not on file   Social Determinants of Health   Financial Resource Strain: Not on file  Food Insecurity: Not on file  Transportation Needs: Not on file  Physical Activity: Not on file  Stress: Not on file  Social Connections: Not on file   Intimate Partner Violence: Not on file    ALLERGIES:    No Known Allergies  CURRENT MEDICATIONS:    Current Outpatient Medications  Medication Sig Dispense Refill   albuterol (VENTOLIN HFA) 108 (90 Base) MCG/ACT inhaler Take 1 puff by mouth as needed.     aspirin 325 MG EC tablet Take 325 mg by mouth daily.     CALCIUM-MAGNESIUM PO Take 1 tablet by mouth daily.     Cholecalciferol (VITAMIN D3 PO) Take 1 tablet by mouth daily.     clobetasol ointment (TEMOVATE) 8.26 % Apply 1 application topically as needed.     Docusate Sodium (DSS) 100 MG CAPS TAKE ONE CAPSULE BY MOUTH TWICE A DAY AS NEEDED     doxycycline (MONODOX) 100 MG capsule Take 100 mg by mouth as needed.     finasteride (PROSCAR) 5 MG tablet Take 5 mg by mouth daily.     fluticasone (FLONASE) 50 MCG/ACT nasal spray Place 1 spray into both nostrils daily.     fluticasone-salmeterol (ADVAIR) 100-50 MCG/ACT AEPB INHALE 1 INHALATION BY MOUTH TWICE A DAY (RINSE MOUTH WELL WITH WATER AFTER EACH USE (CONVERTED FROM SYMBICORT. NOTE NEW DIRECTIONS)     folic acid (FOLVITE) 1 MG tablet Take 1 tablet by mouth daily.  methotrexate (RHEUMATREX) 2.5 MG tablet Take 1 tablet by mouth once a week.     Multiple Vitamins-Minerals (CENTRUM SILVER 50+MEN PO) Take 1 capsule by mouth daily.     Omega-3 1000 MG CAPS Take 1 tablet by mouth daily.     POTASSIUM CHLORIDE PO TAKE ?DOSE BY MOUTH DAILY     Saw Palmetto, Serenoa repens, (SAW PALMETTO PO) TAKE 1 CAP/TAB BY MOUTH DAILY     triamcinolone ointment (KENALOG) 0.1 % APPLY SMALL AMOUNT TO AFFECTED AREAS TO AFFECTED AREA TWICE A DAY AS NEEDED DURING THE WEEK     No current facility-administered medications for this visit.    REVIEW OF SYSTEMS:   [X]  denotes positive finding, [ ]  denotes negative finding Cardiac  Comments:  Chest pain or chest pressure:    Shortness of breath upon exertion:    Short of breath when lying flat:    Irregular heart rhythm:        Vascular    Pain in  calf, thigh, or hip brought on by ambulation:    Pain in feet at night that wakes you up from your sleep:     Blood clot in your veins:    Leg swelling:         Pulmonary    Oxygen at home:    Productive cough:     Wheezing:         Neurologic    Sudden weakness in arms or legs:     Sudden numbness in arms or legs:     Sudden onset of difficulty speaking or slurred speech:    Temporary loss of vision in one eye:     Problems with dizziness:         Gastrointestinal    Blood in stool:      Vomited blood:         Genitourinary    Burning when urinating:     Blood in urine:        Psychiatric    Major depression:         Hematologic    Bleeding problems:    Problems with blood clotting too easily:        Skin    Rashes or ulcers:        Constitutional    Fever or chills:     PHYSICAL EXAM:   Vitals:   10/07/21 1352 10/07/21 1355  BP: 128/82 116/79  Pulse: 93   Resp: 20   Temp: 98 F (36.7 C)   SpO2: 96%   Weight: 144 lb (65.3 kg)   Height: 5\' 6"  (1.676 m)     GENERAL: The patient is a well-nourished male, in no acute distress. The vital signs are documented above. CARDIAC: There is a regular rate and rhythm.  VASCULAR: Palpable pedal pulses. PULMONARY: Nonlabored respirations ABDOMEN: Soft and non-tender  MUSCULOSKELETAL: There are no major deformities or cyanosis. NEUROLOGIC: No focal weakness or paresthesias are detected. SKIN: There are no ulcers or rashes noted. PSYCHIATRIC: The patient has a normal affect.  STUDIES:   I have reviewed his CT angiogram that shows a high-grade, greater than 80% left-sided stenosis.  His bifurcation is high up near the angle of the mandible.  No significant right-sided stenosis  ASSESSMENT and PLAN   Asymptomatic left carotid stenosis: I discussed that I would recommend repair given the degree of stenosis.  I think he is better suited for TCAR because he has a very high bifurcation.  I discussed  the details of the  operation including the risks and benefits.  We discussed the risk of stroke.  He would like to get this done as soon as possible.  I have placed him on the schedule for Wednesday, December 28.  I am starting him on Plavix and Crestor 10 mg today.  He is going to continue take an 81 mg of aspirin.   [name], MD, FACS [facility] [phone] Pager [phone]

## 2021-10-16 NOTE — Progress Notes (Signed)
Surgical Instructions    Your procedure is scheduled on 10/23/21.  Report to [facility] Main Entrance "A" at 5:30 A.M., then check in with the Admitting office.  Call this number if you have problems the morning of surgery:  [phone]   If you have any questions prior to your surgery date call [phone]: Open Monday-Friday 8am-4pm    Remember:  Do not eat or drink after midnight the night before your surgery      Take these medicines the morning of surgery with A SIP OF WATER  albuterol (VENTOLIN HFA) if needed (bring inhaler with you the day of surgery) aspirin  clopidogrel (PLAVIX)  finasteride (PROSCAR) rosuvastatin (CRESTOR) tamsulosin (FLOMAX)   As of today, STOP taking any (unless otherwise instructed by your surgeon) Aleve, Naproxen, Ibuprofen, Motrin, Advil, Goody's, BC's, all herbal medications, fish oil, and all vitamins.     After your COVID test   You are not required to quarantine however you are required to wear a well-fitting mask when you are out and around people not in your household.  If your mask becomes wet or soiled, replace with a new one.  Wash your hands often with soap and water for 20 seconds or clean your hands with an alcohol-based hand sanitizer that contains at least 60% alcohol.  Do not share personal items.  Notify your provider: if you are in close contact with someone who has COVID  or if you develop a fever of 100.4 or greater, sneezing, cough, sore throat, shortness of breath or body aches.             Do not wear jewelry or makeup Do not wear lotions, powders, perfumes/colognes, or deodorant. Men may shave face and neck. Do not bring valuables to the hospital. DO Not wear nail polish, gel polish, artificial nails, or any other type of covering on natural nails including finger and toenails. If patients have artificial nails, gel coating, etc. that need to be removed by a nail salon, please have this removed prior to surgery  or surgery may need to be canceled/delayed if the surgeon/ anesthesia feels like the patient is unable to be adequately monitored.             [place] [facility] is not responsible for any belongings or valuables.  Do NOT Smoke (Tobacco/Vaping)  24 hours prior to your procedure  If you use a CPAP at night, you may bring your mask for your overnight stay.   Contacts, glasses, hearing aids, dentures or partials may not be worn into surgery, please bring cases for these belongings   For patients admitted to the hospital, discharge time will be determined by your treatment team.   Patients discharged the day of surgery will not be allowed to drive home, and someone needs to stay with them for 24 hours.  NO VISITORS WILL BE ALLOWED IN PRE-OP WHERE PATIENTS ARE PREPPED FOR SURGERY.  ONLY 1 SUPPORT PERSON MAY BE PRESENT IN THE WAITING ROOM WHILE YOU ARE IN SURGERY.  IF YOU ARE TO BE ADMITTED, ONCE YOU ARE IN YOUR ROOM YOU WILL BE ALLOWED TWO (2) VISITORS. 1 (ONE) VISITOR MAY STAY OVERNIGHT BUT MUST ARRIVE TO THE ROOM BY 8pm.  Minor children may have two parents present. Special consideration for safety and communication needs will be reviewed on a case by case basis.  Special instructions:    Oral Hygiene is also important to reduce your risk of infection.  Remember - BRUSH YOUR TEETH THE  MORNING OF SURGERY WITH YOUR REGULAR TOOTHPASTE   [place]- Preparing For Surgery  Before surgery, you can play an important role. Because skin is not sterile, your skin needs to be as free of germs as possible. You can reduce the number of germs on your skin by washing with CHG (chlorahexidine gluconate) Soap before surgery.  CHG is an antiseptic cleaner which kills germs and bonds with the skin to continue killing germs even after washing.     Please do not use if you have an allergy to CHG or antibacterial soaps. If your skin becomes reddened/irritated stop using the CHG.  Do not shave (including legs and  underarms) for at least 48 hours prior to first CHG shower. It is OK to shave your face.  Please follow these instructions carefully.     Shower the NIGHT BEFORE SURGERY and the MORNING OF SURGERY with CHG Soap.   If you chose to wash your hair, wash your hair first as usual with your normal shampoo. After you shampoo, rinse your hair and body thoroughly to remove the shampoo.  Then [employer] and genitals (private parts) with your normal soap and rinse thoroughly to remove soap.  After that Use CHG Soap as you would any other liquid soap. You can apply CHG directly to the skin and wash gently with a scrungie or a clean washcloth.   Apply the CHG Soap to your body ONLY FROM THE NECK DOWN.  Do not use on open wounds or open sores. Avoid contact with your eyes, ears, mouth and genitals (private parts). Wash Face and genitals (private parts)  with your normal soap.   Wash thoroughly, paying special attention to the area where your surgery will be performed.  Thoroughly rinse your body with warm water from the neck down.  DO NOT shower/wash with your normal soap after using and rinsing off the CHG Soap.  Pat yourself dry with a CLEAN TOWEL.  Wear CLEAN PAJAMAS to bed the night before surgery  Place CLEAN SHEETS on your bed the night before your surgery  DO NOT SLEEP WITH PETS.   Day of Surgery: Take a shower with CHG soap. Wear Clean/Comfortable clothing the morning of surgery Do not apply any deodorants/lotions.   Remember to brush your teeth WITH YOUR REGULAR TOOTHPASTE.   Please read over the following fact sheets that you were given.

## 2021-10-17 ENCOUNTER — Encounter (HOSPITAL_COMMUNITY)
Admission: RE | Admit: 2021-10-17 | Discharge: 2021-10-17 | Disposition: A | Discharge status: Home / Self Care | Payer: Medicare Other | Source: Ambulatory Visit | Attending: Surgery | Admitting: Surgery

## 2021-10-17 ENCOUNTER — Other Ambulatory Visit: Payer: Self-pay

## 2021-10-17 ENCOUNTER — Encounter (HOSPITAL_COMMUNITY): Payer: Self-pay

## 2021-10-17 VITALS — BP 139/96 | HR 82 | Temp 97.7°F | Resp 18 | Ht 66.0 in | Wt 142.7 lb

## 2021-10-17 DIAGNOSIS — N400 Enlarged prostate without lower urinary tract symptoms: Secondary | ICD-10-CM | POA: Insufficient documentation

## 2021-10-17 DIAGNOSIS — Z419 Encounter for procedure for purposes other than remedying health state, unspecified: Secondary | ICD-10-CM

## 2021-10-17 DIAGNOSIS — Z01818 Encounter for other preprocedural examination: Secondary | ICD-10-CM | POA: Insufficient documentation

## 2021-10-17 DIAGNOSIS — I4510 Unspecified right bundle-branch block: Secondary | ICD-10-CM | POA: Insufficient documentation

## 2021-10-17 DIAGNOSIS — J45909 Unspecified asthma, uncomplicated: Secondary | ICD-10-CM | POA: Diagnosis not present

## 2021-10-17 DIAGNOSIS — L281 Prurigo nodularis: Secondary | ICD-10-CM | POA: Insufficient documentation

## 2021-10-17 DIAGNOSIS — Z79899 Other long term (current) drug therapy: Secondary | ICD-10-CM | POA: Diagnosis not present

## 2021-10-17 DIAGNOSIS — Z7902 Long term (current) use of antithrombotics/antiplatelets: Secondary | ICD-10-CM | POA: Diagnosis not present

## 2021-10-17 DIAGNOSIS — I498 Other specified cardiac arrhythmias: Secondary | ICD-10-CM | POA: Diagnosis not present

## 2021-10-17 DIAGNOSIS — D472 Monoclonal gammopathy: Secondary | ICD-10-CM | POA: Diagnosis not present

## 2021-10-17 DIAGNOSIS — I6522 Occlusion and stenosis of left carotid artery: Secondary | ICD-10-CM | POA: Diagnosis not present

## 2021-10-17 DIAGNOSIS — Z7982 Long term (current) use of aspirin: Secondary | ICD-10-CM | POA: Insufficient documentation

## 2021-10-17 HISTORY — DX: Monoclonal gammopathy: D47.2

## 2021-10-17 HISTORY — DX: Prurigo nodularis: L28.1

## 2021-10-17 HISTORY — DX: Occlusion and stenosis of unspecified carotid artery: I65.29

## 2021-10-17 HISTORY — DX: Unspecified right bundle-branch block: I45.10

## 2021-10-17 HISTORY — DX: Benign prostatic hyperplasia without lower urinary tract symptoms: N40.0

## 2021-10-17 LAB — URINALYSIS, ROUTINE W REFLEX MICROSCOPIC
Bilirubin Urine: NEGATIVE
Glucose, UA: NEGATIVE mg/dL
Hgb urine dipstick: NEGATIVE
Ketones, ur: NEGATIVE mg/dL
Leukocytes,Ua: NEGATIVE
Nitrite: NEGATIVE
Protein, ur: NEGATIVE mg/dL
Specific Gravity, Urine: 1.005 (ref 1.005–1.030)
pH: 7 (ref 5.0–8.0)

## 2021-10-17 LAB — APTT: aPTT: 28 seconds (ref 24–36)

## 2021-10-17 LAB — COMPREHENSIVE METABOLIC PANEL
ALT: 27 U/L (ref 0–44)
AST: 30 U/L (ref 15–41)
Albumin: 3.9 g/dL (ref 3.5–5.0)
Alkaline Phosphatase: 65 U/L (ref 38–126)
Anion gap: 8 (ref 5–15)
BUN: 11 mg/dL (ref 8–23)
CO2: 28 mmol/L (ref 22–32)
Calcium: 9.6 mg/dL (ref 8.9–10.3)
Chloride: 103 mmol/L (ref 98–111)
Creatinine, Ser: 1.17 mg/dL (ref 0.61–1.24)
GFR, Estimated: >60 mL/min (ref >60)
Glucose, Bld: 98 mg/dL (ref 70–99)
Potassium: 3.9 mmol/L (ref 3.5–5.1)
Sodium: 139 mmol/L (ref 135–145)
Total Bilirubin: 0.6 mg/dL (ref 0.3–1.2)
Total Protein: 7.4 g/dL (ref 6.5–8.1)

## 2021-10-17 LAB — CBC
HCT: 43.3 % (ref 39.0–52.0)
Hemoglobin: 13.8 g/dL (ref 13.0–17.0)
MCH: 26.8 pg (ref 26.0–34.0)
MCHC: 31.9 g/dL (ref 30.0–36.0)
MCV: 84.1 fL (ref 80.0–100.0)
Platelets: 176 10*3/uL (ref 150–400)
RBC: 5.15 MIL/uL (ref 4.22–5.81)
RDW: 15.3 % (ref 11.5–15.5)
WBC: 5.7 10*3/uL (ref 4.0–10.5)
nRBC: 0 % (ref 0.0–0.2)

## 2021-10-17 LAB — SURGICAL PCR SCREEN
MRSA, PCR: NEGATIVE
Staphylococcus aureus: NEGATIVE

## 2021-10-17 LAB — PROTIME-INR
INR: 0.9 (ref 0.8–1.2)
Prothrombin Time: 12.6 seconds (ref 11.4–15.2)

## 2021-10-17 LAB — TYPE AND SCREEN
ABO/RH(D): O POS
Antibody Screen: NEGATIVE

## 2021-10-17 NOTE — Progress Notes (Signed)
Anesthesia Chart Review:  Case: [id number] Date/Time: 10/23/21 0715   Procedure: LEFT TRANSCAROTID ARTERY REVASCULARIZATION (Left)   Anesthesia type: General   Pre-op diagnosis: carotid artery stenosis   Location: [facility] OR ROOM 16 / [place] OR   Surgeons: Serafina Mitchell, MD       DISCUSSION: Patient is a 80 year old male scheduled for the above procedure. He was evaluated by Dr. Trula Slade on 10/07/21 after CTA showed high grade, > 46% LICA stenosis with high bifurcation and no significant stenosis on the right. He was asymptomatic. Imaging reportedly ordered to evaluate pulsatile tinnitus mostly on the left. The above procedure recommended.   Other history includes never smoker, asthma, carotid artery disease, prurigo nodularis, IgA lambda MGUS (diagnosed 02/2017), RBBB, BPH (foley placed 08/18/21 in ED for urinary retention).   He is on methotrexate for prurigo nodularis and is followed by a dermatologist at the [facility]   Preoperative EKG showed RBBB. PMHx listed on 08/21/21 [facility] PCP office note by Dr. Rondell Reams includes RBBB. She referred him to [facility] following finding of severe LICA stenosis. Per her 10/09/21 notation, she is aware of surgery plans. Per [facility] notes, he is active and runs 4 miles every Monday.   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  He is to continue ASA and Plavix perioperatively.   Preoperative COVID-19 test is scheduled for 10/22/21. Anesthesia team to evaluate on the day of surgery.   VS: BP (!) 139/96    Pulse 82    Temp 36.5 C (Oral)    Resp 18    Ht _0  (1.676 m)    Wt 64.7 kg    SpO2 100%    BMI 23.03 kg/m    PROVIDERS: Borum, Jaci Standard, MD is PCP  Owens Loffler, MD is GI Irine Seal, MD is urologist ([facility])  Rico Sheehan, DO is HEM-[facility]). Last visit 07/16/21 with continued surveillance. No indication for bone marrow biopsy currently.  Danton Sewer, MD is pulmonologist [facility]). Last visit 03/05/21 for follow-up "moderate persistent  asthma with minimal fixed airflow obstruction". Asthma felt under good contol on [place] BID. Jogging 4-6 miles on Monday's. Recent PFTs showed improved FEV1. Follow-up in six months planned.  Saundra Shelling, MD is dermatologist [facility]). Last visit 07/08/21.   LABS: Labs reviewed: Acceptable for surgery. (all labs ordered are listed, but only abnormal results are displayed)  Labs Reviewed  SURGICAL PCR SCREEN  COMPREHENSIVE METABOLIC PANEL  PROTIME-INR  APTT  URINALYSIS, ROUTINE W REFLEX MICROSCOPIC  CBC  TYPE AND SCREEN    Per Dr. Janifer Adie 03/05/21 [place] note, "PFT 01/2021: FEV1 1.91 (80%), ratio 53, DLCO 78, inadequ lung vol"   IMAGES: CTA Neck 10/02/21 ([facility]): Impression: 1. CTA findings concerning for large noncalcified mural thrombus  involving the left carotid bulb and origin/proximal left ICA with  findings concerning for a short segment of severe stenosis  approximately as described in detail above. Vascular surgical  consultation is recommended.  2. Unremarkable right carotid arteries. Unremarkable posterior  circulation. The portions imaged of the intracranial vessels are  unremarkable.  3. Additional incidental findings as above. [See full report in Care Everywhere]  US Carotid 10/02/21 ([place]): Impression: 1. Severely elevated velocities at the LEFT carotid bulb/left ICA  junction corresponding to greater than 70% stenosis. Caused by a  large focus of atherosclerotic plaque. Expedited CT angiography  of the neck is suggested for further evaluation.  2. Questionable ulcerated plaque within the left carotid bulb.  3. No hemodynamically significant right ICA stenosis.   MRI Prostate 09/15/21:  IMPRESSION: No radiographic evidence of high-grade prostate carcinoma. PI-RADS 1 (v2.1): Very Low (clinically significant cancer highly unlikely)   EKG: 10/17/21:  Normal sinus rhythm with sinus arrhythmia Right bundle branch block Abnormal ECG   CV: N/A   Past  Medical History:  Diagnosis Date   Asthma    BPH (benign prostatic hyperplasia)    Carotid stenosis    Colon polyps    IgA monoclonal gammopathy of uncertain significance    Prurigo nodularis    Right bundle branch block     Past Surgical History:  Procedure Laterality Date   TONSILLECTOMY      MEDICATIONS:  albuterol (VENTOLIN HFA) 108 (90 Base) MCG/ACT inhaler   aspirin 81 MG EC tablet   Cholecalciferol (VITAMIN D3 PO)   clopidogrel (PLAVIX) 75 MG tablet   Docusate Sodium (DSS) 100 MG CAPS   finasteride (PROSCAR) 5 MG tablet   fluticasone-salmeterol (ADVAIR) 100-50 MCG/ACT AEPB   folic acid (FOLVITE) 1 MG tablet   methotrexate (RHEUMATREX) 2.5 MG tablet   Multiple Vitamins-Minerals (CENTRUM SILVER 50+MEN PO)   Omega-3 1000 MG CAPS   POTASSIUM PO   rosuvastatin (CRESTOR) 10 MG tablet   Saw Palmetto, Serenoa repens, (SAW PALMETTO PO)   tamsulosin (FLOMAX) 0.4 MG CAPS capsule   triamcinolone ointment (KENALOG) 0.1 %   No current facility-administered medications for this encounter.    Myra Gianotti, PA-C Surgical Short Stay/Anesthesiology [facility] Phone [phone] [facility] Phone [phone] 10/17/2021 6:00 PM

## 2021-10-17 NOTE — Anesthesia Preprocedure Evaluation (Addendum)
Anesthesia Evaluation  Patient identified by MRN, date of birth, ID band Patient awake    Reviewed: Allergy & Precautions, NPO status , Patient's Chart, lab work & pertinent test results  Airway Mallampati: II  TM Distance: >3 FB Neck ROM: Full    Dental no notable dental hx. (+) Dental Advisory Given, Teeth Intact   Pulmonary asthma ,    Pulmonary exam normal breath sounds clear to auscultation       Cardiovascular Normal cardiovascular exam+ dysrhythmias  Rhythm:Regular Rate:Normal     Neuro/Psych negative neurological ROS     GI/Hepatic negative GI ROS, Neg liver ROS,   Endo/Other  negative endocrine ROS  Renal/GU negative Renal ROS     Musculoskeletal negative musculoskeletal ROS (+)   Abdominal   Peds  Hematology negative hematology ROS (+)   Anesthesia Other Findings   Reproductive/Obstetrics                            Anesthesia Physical Anesthesia Plan  ASA: 3  Anesthesia Plan:    Post-op Pain Management: Minimal or no pain anticipated   Induction: Intravenous  PONV Risk Score and Plan: 1 and Treatment may vary due to age or medical condition, Ondansetron, Midazolam and Dexamethasone  Airway Management Planned: Oral ETT  Additional Equipment: Arterial line  Intra-op Plan:   Post-operative Plan: Extubation in OR  Informed Consent: I have reviewed the patients History and Physical, chart, labs and discussed the procedure including the risks, benefits and alternatives for the proposed anesthesia with the patient or authorized representative who has indicated his/her understanding and acceptance.     Dental advisory given  Plan Discussed with: CRNA  Anesthesia Plan Comments: (PAT note written 10/17/2021 by Myra Gianotti, PA-C. )     Anesthesia Quick Evaluation

## 2021-10-17 NOTE — Progress Notes (Signed)
PCP - Dr. Herold Harms at [place] - Denies  PPM/ICD - Denies  Chest x-ray - N/A EKG - 10/17/21 Stress Test - Denies ECHO - Denies Cardiac Cath - Denies  Sleep Study - Denies  Diabetic: Denies  Blood Thinner Instructions: Continue ASA and Plavix.  ERAS Protcol - No  COVID TEST- Scheduled 10/22/21 at 1300   Anesthesia review: Yes, abnormal EKG  Patient denies shortness of breath, fever, cough and chest pain at PAT appointment   All instructions explained to the patient, with a verbal understanding of the material. Patient agrees to go over the instructions while at home for a better understanding. Patient also instructed to self quarantine after being tested for COVID-19. The opportunity to ask questions was provided.

## 2021-10-22 ENCOUNTER — Other Ambulatory Visit (HOSPITAL_COMMUNITY)
Admission: RE | Admit: 2021-10-22 | Discharge: 2021-10-22 | Disposition: A | Discharge status: Home / Self Care | Payer: Medicare Other | Source: Ambulatory Visit | Attending: Surgery | Admitting: Surgery

## 2021-10-22 DIAGNOSIS — Z20822 Contact with and (suspected) exposure to covid-19: Secondary | ICD-10-CM | POA: Insufficient documentation

## 2021-10-22 DIAGNOSIS — Z01812 Encounter for preprocedural laboratory examination: Secondary | ICD-10-CM | POA: Insufficient documentation

## 2021-10-22 DIAGNOSIS — Z01818 Encounter for other preprocedural examination: Secondary | ICD-10-CM

## 2021-10-22 LAB — SARS CORONAVIRUS 2 (TAT 6-24 HRS): SARS Coronavirus 2: NEGATIVE

## 2021-10-23 ENCOUNTER — Inpatient Hospital Stay (HOSPITAL_COMMUNITY): Payer: Medicare Other

## 2021-10-23 ENCOUNTER — Encounter (HOSPITAL_COMMUNITY): Payer: Self-pay | Admitting: Surgery

## 2021-10-23 ENCOUNTER — Encounter (HOSPITAL_COMMUNITY)
Admission: RE | Disposition: A | Discharge status: Home / Self Care | Payer: Self-pay | Source: Home / Self Care | Attending: Surgery

## 2021-10-23 ENCOUNTER — Inpatient Hospital Stay (HOSPITAL_COMMUNITY): Payer: Medicare Other | Admitting: Anesthesiology

## 2021-10-23 ENCOUNTER — Inpatient Hospital Stay (HOSPITAL_COMMUNITY): Payer: Medicare Other | Admitting: Physician Assistant

## 2021-10-23 ENCOUNTER — Other Ambulatory Visit: Payer: Self-pay

## 2021-10-23 ENCOUNTER — Inpatient Hospital Stay (HOSPITAL_COMMUNITY)
Admission: RE | Admit: 2021-10-23 | Discharge: 2021-10-25 | DRG: 035 | Disposition: A | Discharge status: Home / Self Care | Payer: Medicare Other | Attending: Surgery | Admitting: Surgery

## 2021-10-23 DIAGNOSIS — J45909 Unspecified asthma, uncomplicated: Secondary | ICD-10-CM | POA: Diagnosis not present

## 2021-10-23 DIAGNOSIS — I9581 Postprocedural hypotension: Secondary | ICD-10-CM | POA: Diagnosis not present

## 2021-10-23 DIAGNOSIS — Z8249 Family history of ischemic heart disease and other diseases of the circulatory system: Secondary | ICD-10-CM | POA: Diagnosis not present

## 2021-10-23 DIAGNOSIS — D62 Acute posthemorrhagic anemia: Secondary | ICD-10-CM | POA: Diagnosis not present

## 2021-10-23 DIAGNOSIS — Z7982 Long term (current) use of aspirin: Secondary | ICD-10-CM

## 2021-10-23 DIAGNOSIS — Z8042 Family history of malignant neoplasm of prostate: Secondary | ICD-10-CM

## 2021-10-23 DIAGNOSIS — Z823 Family history of stroke: Secondary | ICD-10-CM | POA: Diagnosis not present

## 2021-10-23 DIAGNOSIS — Z006 Encounter for examination for normal comparison and control in clinical research program: Secondary | ICD-10-CM

## 2021-10-23 DIAGNOSIS — N400 Enlarged prostate without lower urinary tract symptoms: Secondary | ICD-10-CM | POA: Diagnosis not present

## 2021-10-23 DIAGNOSIS — Z8719 Personal history of other diseases of the digestive system: Secondary | ICD-10-CM

## 2021-10-23 DIAGNOSIS — Z79899 Other long term (current) drug therapy: Secondary | ICD-10-CM

## 2021-10-23 DIAGNOSIS — I6522 Occlusion and stenosis of left carotid artery: Secondary | ICD-10-CM | POA: Diagnosis not present

## 2021-10-23 DIAGNOSIS — Z20822 Contact with and (suspected) exposure to covid-19: Secondary | ICD-10-CM | POA: Diagnosis present

## 2021-10-23 HISTORY — PX: TRANSCAROTID ARTERY REVASCULARIZATIONÂ: SHX6778

## 2021-10-23 LAB — CBC
HCT: 37.5 % — ABNORMAL LOW (ref 39.0–52.0)
Hemoglobin: 12.4 g/dL — ABNORMAL LOW (ref 13.0–17.0)
MCH: 27.5 pg (ref 26.0–34.0)
MCHC: 33.1 g/dL (ref 30.0–36.0)
MCV: 83.1 fL (ref 80.0–100.0)
Platelets: 157 10*3/uL (ref 150–400)
RBC: 4.51 MIL/uL (ref 4.22–5.81)
RDW: 15 % (ref 11.5–15.5)
WBC: 7.7 10*3/uL (ref 4.0–10.5)
nRBC: 0 % (ref 0.0–0.2)

## 2021-10-23 LAB — CREATININE, SERUM
Creatinine, Ser: 1.08 mg/dL (ref 0.61–1.24)
GFR, Estimated: >60 mL/min (ref >60)

## 2021-10-23 LAB — GLUCOSE, CAPILLARY: Glucose-Capillary: 157 mg/dL — ABNORMAL HIGH (ref 70–99)

## 2021-10-23 LAB — ABO/RH: ABO/RH(D): O POS

## 2021-10-23 SURGERY — TRANSCAROTID ARTERY REVASCULARIZATION (TCAR)
Anesthesia: General | Site: Neck | Laterality: Left

## 2021-10-23 MED ORDER — DOCUSATE SODIUM 100 MG PO CAPS
100.0000 mg | ORAL_CAPSULE | Freq: Every day | ORAL | Status: DC
  Administered 2021-10-24 – 2021-10-25 (×2): 100 mg via ORAL
  Filled 2021-10-23 (×2): qty 1

## 2021-10-23 MED ORDER — NOREPINEPHRINE 4 MG/250ML-% IV SOLN
2.0000 ug/min | INTRAVENOUS | Status: DC
  Administered 2021-10-23: 23:00:00 5 ug/min via INTRAVENOUS
  Administered 2021-10-24: 12:00:00 4 ug/min via INTRAVENOUS
  Administered 2021-10-24: 04:00:00 10 ug/min via INTRAVENOUS
  Filled 2021-10-23 (×3): qty 250

## 2021-10-23 MED ORDER — FENTANYL CITRATE (PF) 100 MCG/2ML IJ SOLN
25.0000 ug | INTRAMUSCULAR | Status: DC | PRN
  Administered 2021-10-23: 11:00:00 25 ug via INTRAVENOUS

## 2021-10-23 MED ORDER — ONDANSETRON HCL 4 MG/2ML IJ SOLN
INTRAMUSCULAR | Status: DC | PRN
  Administered 2021-10-23: 4 mg via INTRAVENOUS

## 2021-10-23 MED ORDER — ROCURONIUM BROMIDE 10 MG/ML (PF) SYRINGE
PREFILLED_SYRINGE | INTRAVENOUS | Status: AC
  Filled 2021-10-23: qty 10

## 2021-10-23 MED ORDER — PHENYLEPHRINE 40 MCG/ML (10ML) SYRINGE FOR IV PUSH (FOR BLOOD PRESSURE SUPPORT)
PREFILLED_SYRINGE | INTRAVENOUS | Status: DC | PRN
  Administered 2021-10-23 (×2): 80 ug via INTRAVENOUS

## 2021-10-23 MED ORDER — 0.9 % SODIUM CHLORIDE (POUR BTL) OPTIME
TOPICAL | Status: DC | PRN
  Administered 2021-10-23: 09:00:00 1000 mL

## 2021-10-23 MED ORDER — PROMETHAZINE HCL 25 MG/ML IJ SOLN
6.2500 mg | INTRAMUSCULAR | Status: DC | PRN
  Administered 2021-10-23: 11:00:00 12.5 mg via INTRAVENOUS

## 2021-10-23 MED ORDER — GLYCOPYRROLATE PF 0.2 MG/ML IJ SOSY
PREFILLED_SYRINGE | INTRAMUSCULAR | Status: AC
  Filled 2021-10-23: qty 2

## 2021-10-23 MED ORDER — SUGAMMADEX SODIUM 500 MG/5ML IV SOLN
INTRAVENOUS | Status: AC
  Filled 2021-10-23: qty 5

## 2021-10-23 MED ORDER — FENTANYL CITRATE (PF) 100 MCG/2ML IJ SOLN
INTRAMUSCULAR | Status: AC
  Filled 2021-10-23: qty 2

## 2021-10-23 MED ORDER — METOPROLOL TARTRATE 5 MG/5ML IV SOLN: 2.0000 mg | INTRAVENOUS | Status: DC | PRN

## 2021-10-23 MED ORDER — VASOPRESSIN 20 UNIT/ML IV SOLN
INTRAVENOUS | Status: DC | PRN
  Administered 2021-10-23 (×2): 1 [IU] via INTRAVENOUS
  Administered 2021-10-23: .5 [IU] via INTRAVENOUS
  Administered 2021-10-23: 1 [IU] via INTRAVENOUS

## 2021-10-23 MED ORDER — ROSUVASTATIN CALCIUM 5 MG PO TABS: 10.0000 mg | ORAL_TABLET | Freq: Every day | ORAL | Status: DC

## 2021-10-23 MED ORDER — PROPOFOL 10 MG/ML IV BOLUS
INTRAVENOUS | Status: DC | PRN
  Administered 2021-10-23 (×2): 50 mg via INTRAVENOUS
  Administered 2021-10-23: 100 mg via INTRAVENOUS

## 2021-10-23 MED ORDER — HEPARIN SODIUM (PORCINE) 5000 UNIT/ML IJ SOLN
5000.0000 [IU] | Freq: Three times a day (TID) | INTRAMUSCULAR | Status: DC
  Administered 2021-10-24 – 2021-10-25 (×4): 5000 [IU] via SUBCUTANEOUS
  Filled 2021-10-23 (×4): qty 1

## 2021-10-23 MED ORDER — PHENYLEPHRINE HCL-NACL 20-0.9 MG/250ML-% IV SOLN
INTRAVENOUS | Status: DC | PRN
  Administered 2021-10-23: 40 ug/min via INTRAVENOUS

## 2021-10-23 MED ORDER — HYDROMORPHONE HCL 1 MG/ML IJ SOLN
0.5000 mg | INTRAMUSCULAR | Status: DC | PRN
  Administered 2021-10-23: 18:00:00 0.5 mg via INTRAVENOUS
  Filled 2021-10-23: qty 1

## 2021-10-23 MED ORDER — ONDANSETRON HCL 4 MG/2ML IJ SOLN: 4.0000 mg | Freq: Four times a day (QID) | INTRAMUSCULAR | Status: DC | PRN

## 2021-10-23 MED ORDER — HEPARIN 6000 UNIT IRRIGATION SOLUTION
Status: AC
  Filled 2021-10-23: qty 500

## 2021-10-23 MED ORDER — LIDOCAINE HCL (PF) 1 % IJ SOLN
INTRAMUSCULAR | Status: AC
  Filled 2021-10-23: qty 30

## 2021-10-23 MED ORDER — HEPARIN SODIUM (PORCINE) 1000 UNIT/ML IJ SOLN
INTRAMUSCULAR | Status: DC | PRN
  Administered 2021-10-23: 2000 [IU] via INTRAVENOUS
  Administered 2021-10-23: 6500 [IU] via INTRAVENOUS
  Administered 2021-10-23: 1000 [IU] via INTRAVENOUS

## 2021-10-23 MED ORDER — PANTOPRAZOLE SODIUM 40 MG PO TBEC
40.0000 mg | DELAYED_RELEASE_TABLET | Freq: Every day | ORAL | Status: DC
  Administered 2021-10-23 – 2021-10-25 (×3): 40 mg via ORAL
  Filled 2021-10-23 (×3): qty 1

## 2021-10-23 MED ORDER — CEFAZOLIN SODIUM-DEXTROSE 2-4 GM/100ML-% IV SOLN
INTRAVENOUS | Status: AC
  Administered 2021-10-23: 16:00:00 2 g via INTRAVENOUS
  Filled 2021-10-23: qty 100

## 2021-10-23 MED ORDER — ROSUVASTATIN CALCIUM 20 MG PO TABS
20.0000 mg | ORAL_TABLET | Freq: Every day | ORAL | Status: DC
  Administered 2021-10-24 – 2021-10-25 (×2): 20 mg via ORAL
  Filled 2021-10-23 (×2): qty 1

## 2021-10-23 MED ORDER — MOMETASONE FURO-FORMOTEROL FUM 100-5 MCG/ACT IN AERO
2.0000 | INHALATION_SPRAY | Freq: Two times a day (BID) | RESPIRATORY_TRACT | Status: DC
  Administered 2021-10-23 – 2021-10-25 (×4): 2 via RESPIRATORY_TRACT
  Filled 2021-10-23: qty 8.8

## 2021-10-23 MED ORDER — ALUM & MAG HYDROXIDE-SIMETH 200-200-20 MG/5ML PO SUSP: 15.0000 mL | ORAL | Status: DC | PRN

## 2021-10-23 MED ORDER — GLYCOPYRROLATE PF 0.2 MG/ML IJ SOSY
PREFILLED_SYRINGE | INTRAMUSCULAR | Status: DC | PRN
  Administered 2021-10-23 (×2): .1 mg via INTRAVENOUS

## 2021-10-23 MED ORDER — TAMSULOSIN HCL 0.4 MG PO CAPS
0.4000 mg | ORAL_CAPSULE | Freq: Every day | ORAL | Status: DC
  Administered 2021-10-23 – 2021-10-25 (×3): 0.4 mg via ORAL
  Filled 2021-10-23 (×3): qty 1

## 2021-10-23 MED ORDER — VASOPRESSIN 20 UNIT/ML IV SOLN
INTRAVENOUS | Status: AC
  Filled 2021-10-23: qty 1

## 2021-10-23 MED ORDER — PHENYLEPHRINE 40 MCG/ML (10ML) SYRINGE FOR IV PUSH (FOR BLOOD PRESSURE SUPPORT)
PREFILLED_SYRINGE | INTRAVENOUS | Status: AC
  Filled 2021-10-23: qty 10

## 2021-10-23 MED ORDER — CLOPIDOGREL BISULFATE 75 MG PO TABS
75.0000 mg | ORAL_TABLET | Freq: Every day | ORAL | Status: DC
  Administered 2021-10-24 – 2021-10-25 (×2): 75 mg via ORAL
  Filled 2021-10-23 (×2): qty 1

## 2021-10-23 MED ORDER — FENTANYL CITRATE (PF) 250 MCG/5ML IJ SOLN
INTRAMUSCULAR | Status: AC
  Filled 2021-10-23: qty 5

## 2021-10-23 MED ORDER — SODIUM CHLORIDE 0.9 % IV SOLN: 250.0000 mL | INTRAVENOUS | Status: DC | PRN

## 2021-10-23 MED ORDER — DOPAMINE-DEXTROSE 3.2-5 MG/ML-% IV SOLN
INTRAVENOUS | Status: AC
  Filled 2021-10-23: qty 250

## 2021-10-23 MED ORDER — OXYCODONE-ACETAMINOPHEN 5-325 MG PO TABS: 1.0000 | ORAL_TABLET | ORAL | Status: DC | PRN

## 2021-10-23 MED ORDER — HEPARIN 6000 UNIT IRRIGATION SOLUTION
Status: DC | PRN
  Administered 2021-10-23: 1

## 2021-10-23 MED ORDER — EPHEDRINE SULFATE-NACL 50-0.9 MG/10ML-% IV SOSY
PREFILLED_SYRINGE | INTRAVENOUS | Status: DC | PRN
  Administered 2021-10-23 (×2): 5 mg via INTRAVENOUS
  Administered 2021-10-23: 10 mg via INTRAVENOUS

## 2021-10-23 MED ORDER — METHOTREXATE 2.5 MG PO TABS: 2.5000 mg | ORAL_TABLET | ORAL | Status: DC

## 2021-10-23 MED ORDER — ONDANSETRON HCL 4 MG/2ML IJ SOLN
INTRAMUSCULAR | Status: AC
  Filled 2021-10-23: qty 2

## 2021-10-23 MED ORDER — PROTAMINE SULFATE 10 MG/ML IV SOLN
INTRAVENOUS | Status: DC | PRN
  Administered 2021-10-23: 10 mg via INTRAVENOUS
  Administered 2021-10-23: 5 mg via INTRAVENOUS
  Administered 2021-10-23 (×2): 15 mg via INTRAVENOUS

## 2021-10-23 MED ORDER — DOPAMINE-DEXTROSE 3.2-5 MG/ML-% IV SOLN
7.0000 ug/kg/min | INTRAVENOUS | Status: DC
  Administered 2021-10-23: 7 ug/kg/min via INTRAVENOUS

## 2021-10-23 MED ORDER — SODIUM CHLORIDE 0.9% FLUSH
3.0000 mL | Freq: Two times a day (BID) | INTRAVENOUS | Status: DC
  Administered 2021-10-24 – 2021-10-25 (×3): 3 mL via INTRAVENOUS

## 2021-10-23 MED ORDER — LABETALOL HCL 5 MG/ML IV SOLN: 10.0000 mg | INTRAVENOUS | Status: DC | PRN

## 2021-10-23 MED ORDER — LIDOCAINE 2% (20 MG/ML) 5 ML SYRINGE
INTRAMUSCULAR | Status: AC
  Filled 2021-10-23: qty 5

## 2021-10-23 MED ORDER — EPHEDRINE 5 MG/ML INJ
INTRAVENOUS | Status: AC
  Filled 2021-10-23: qty 5

## 2021-10-23 MED ORDER — CHLORHEXIDINE GLUCONATE 0.12 % MT SOLN
15.0000 mL | Freq: Once | OROMUCOSAL | Status: AC
  Administered 2021-10-23: 07:00:00 15 mL via OROMUCOSAL
  Filled 2021-10-23: qty 15

## 2021-10-23 MED ORDER — CHLORHEXIDINE GLUCONATE CLOTH 2 % EX PADS: 6.0000 | MEDICATED_PAD | Freq: Once | CUTANEOUS | Status: DC

## 2021-10-23 MED ORDER — HEMOSTATIC AGENTS (NO CHARGE) OPTIME
TOPICAL | Status: DC | PRN
  Administered 2021-10-23: 1 via TOPICAL

## 2021-10-23 MED ORDER — HYDRALAZINE HCL 20 MG/ML IJ SOLN: 5.0000 mg | INTRAMUSCULAR | Status: DC | PRN

## 2021-10-23 MED ORDER — SODIUM CHLORIDE 0.9 % IV SOLN: INTRAVENOUS | Status: DC

## 2021-10-23 MED ORDER — MAGNESIUM SULFATE 2 GM/50ML IV SOLN: 2.0000 g | Freq: Every day | INTRAVENOUS | Status: DC | PRN

## 2021-10-23 MED ORDER — SODIUM CHLORIDE 0.9 % IV SOLN
500.0000 mL | Freq: Once | INTRAVENOUS | Status: AC | PRN
  Administered 2021-10-23 (×2): 500 mL via INTRAVENOUS

## 2021-10-23 MED ORDER — LACTATED RINGERS IV SOLN: INTRAVENOUS | Status: DC

## 2021-10-23 MED ORDER — ROCURONIUM BROMIDE 10 MG/ML (PF) SYRINGE
PREFILLED_SYRINGE | INTRAVENOUS | Status: DC | PRN
  Administered 2021-10-23 (×2): 50 mg via INTRAVENOUS

## 2021-10-23 MED ORDER — LACTATED RINGERS IV SOLN: INTRAVENOUS | Status: DC | PRN

## 2021-10-23 MED ORDER — PHENOL 1.4 % MT LIQD
1.0000 | OROMUCOSAL | Status: DC | PRN
  Administered 2021-10-23: 16:00:00 1 via OROMUCOSAL
  Filled 2021-10-23: qty 177

## 2021-10-23 MED ORDER — CEFAZOLIN SODIUM-DEXTROSE 2-4 GM/100ML-% IV SOLN
2.0000 g | Freq: Three times a day (TID) | INTRAVENOUS | Status: AC
  Administered 2021-10-23: 23:00:00 2 g via INTRAVENOUS
  Filled 2021-10-23 (×2): qty 100

## 2021-10-23 MED ORDER — ACETAMINOPHEN 650 MG RE SUPP: 325.0000 mg | RECTAL | Status: DC | PRN

## 2021-10-23 MED ORDER — LIDOCAINE 2% (20 MG/ML) 5 ML SYRINGE
INTRAMUSCULAR | Status: DC | PRN
  Administered 2021-10-23: 60 mg via INTRAVENOUS

## 2021-10-23 MED ORDER — GUAIFENESIN-DM 100-10 MG/5ML PO SYRP: 15.0000 mL | ORAL_SOLUTION | ORAL | Status: DC | PRN

## 2021-10-23 MED ORDER — DOPAMINE-DEXTROSE 3.2-5 MG/ML-% IV SOLN
5.0000 ug/kg/min | INTRAVENOUS | Status: DC
  Administered 2021-10-23: 5 ug/kg/min via INTRAVENOUS

## 2021-10-23 MED ORDER — SODIUM CHLORIDE 0.9 % IV BOLUS
500.0000 mL | Freq: Once | INTRAVENOUS | Status: AC
  Administered 2021-10-23: 11:00:00 500 mL via INTRAVENOUS

## 2021-10-23 MED ORDER — PROMETHAZINE HCL 25 MG/ML IJ SOLN
INTRAMUSCULAR | Status: AC
  Filled 2021-10-23: qty 1

## 2021-10-23 MED ORDER — ORAL CARE MOUTH RINSE: 15.0000 mL | Freq: Once | OROMUCOSAL | Status: AC

## 2021-10-23 MED ORDER — POTASSIUM CHLORIDE CRYS ER 20 MEQ PO TBCR: 20.0000 meq | EXTENDED_RELEASE_TABLET | Freq: Every day | ORAL | Status: DC | PRN

## 2021-10-23 MED ORDER — CEFAZOLIN SODIUM-DEXTROSE 2-4 GM/100ML-% IV SOLN
2.0000 g | INTRAVENOUS | Status: AC
  Administered 2021-10-23: 08:00:00 2 g via INTRAVENOUS

## 2021-10-23 MED ORDER — DOPAMINE-DEXTROSE 3.2-5 MG/ML-% IV SOLN
0.0000 ug/kg/min | INTRAVENOUS | Status: DC
  Administered 2021-10-23: 5 ug/kg/min via INTRAVENOUS

## 2021-10-23 MED ORDER — ASPIRIN EC 81 MG PO TBEC
81.0000 mg | DELAYED_RELEASE_TABLET | Freq: Every day | ORAL | Status: DC
  Administered 2021-10-24 – 2021-10-25 (×2): 81 mg via ORAL
  Filled 2021-10-23 (×2): qty 1

## 2021-10-23 MED ORDER — IODIXANOL 320 MG/ML IV SOLN
INTRAVENOUS | Status: DC | PRN
  Administered 2021-10-23: 07:00:00 23 mL

## 2021-10-23 MED ORDER — ACETAMINOPHEN 325 MG PO TABS
325.0000 mg | ORAL_TABLET | ORAL | Status: DC | PRN
  Administered 2021-10-23 – 2021-10-24 (×2): 650 mg via ORAL
  Filled 2021-10-23 (×2): qty 2

## 2021-10-23 MED ORDER — PROPOFOL 10 MG/ML IV BOLUS
INTRAVENOUS | Status: AC
  Filled 2021-10-23: qty 20

## 2021-10-23 MED ORDER — SENNOSIDES-DOCUSATE SODIUM 8.6-50 MG PO TABS: 1.0000 | ORAL_TABLET | Freq: Every evening | ORAL | Status: DC | PRN

## 2021-10-23 MED ORDER — FINASTERIDE 5 MG PO TABS
5.0000 mg | ORAL_TABLET | Freq: Every day | ORAL | Status: DC
  Administered 2021-10-24 – 2021-10-25 (×2): 5 mg via ORAL
  Filled 2021-10-23 (×2): qty 1

## 2021-10-23 MED ORDER — SODIUM CHLORIDE 0.9% FLUSH: 3.0000 mL | INTRAVENOUS | Status: DC | PRN

## 2021-10-23 MED ORDER — FENTANYL CITRATE (PF) 100 MCG/2ML IJ SOLN
INTRAMUSCULAR | Status: DC | PRN
  Administered 2021-10-23: 100 ug via INTRAVENOUS

## 2021-10-23 SURGICAL SUPPLY — 64 items
ADH SKN CLS APL DERMABOND .7 (GAUZE/BANDAGES/DRESSINGS) ×2
AGENT HMST KT MTR STRL THRMB (HEMOSTASIS) ×1
BAG BANDED W/RUBBER/TAPE 36X54 (MISCELLANEOUS) ×3 IMPLANT
BAG COUNTER SPONGE SURGICOUNT (BAG) ×2 IMPLANT
BAG SPNG CNTER NS LX DISP (BAG) ×1
BAG SURGICOUNT SPONGE COUNTING (BAG) ×1
BALLN STERLING RX 6X30X80 (BALLOONS) ×3
BALLN STERLING RX 7X30X80 (BALLOONS) ×3
BALLOON STERLING RX 6X30X80 (BALLOONS) IMPLANT
BALLOON STERLING RX 7X30X80 (BALLOONS) IMPLANT
CANISTER SUCT 3000ML PPV (MISCELLANEOUS) ×3 IMPLANT
CATH SUCT 10FR WHISTLE TIP (CATHETERS) ×1 IMPLANT
CLIP VESOCCLUDE MED 6/CT (CLIP) ×3 IMPLANT
CLIP VESOCCLUDE SM WIDE 6/CT (CLIP) ×3 IMPLANT
COVER DOME SNAP 22 D (MISCELLANEOUS) ×3 IMPLANT
COVER PROBE W GEL 5X96 (DRAPES) ×3 IMPLANT
DERMABOND ADVANCED (GAUZE/BANDAGES/DRESSINGS) ×4
DERMABOND ADVANCED .7 DNX12 (GAUZE/BANDAGES/DRESSINGS) ×1 IMPLANT
DRAPE FEMORAL ANGIO 80X135IN (DRAPES) ×3 IMPLANT
ELECT REM PT RETURN 9FT ADLT (ELECTROSURGICAL) ×3
ELECTRODE REM PT RTRN 9FT ADLT (ELECTROSURGICAL) ×1 IMPLANT
GAUZE 4X4 16PLY ~~LOC~~+RFID DBL (SPONGE) ×2 IMPLANT
GLOVE SRG 8 PF TXTR STRL LF DI (GLOVE) ×1 IMPLANT
GLOVE SURG MICRO LTX SZ6 (GLOVE) ×2 IMPLANT
GLOVE SURG POLYISO LF SZ7 (GLOVE) ×2 IMPLANT
GLOVE SURG POLYISO LF SZ7.5 (GLOVE) ×3 IMPLANT
GLOVE SURG UNDER POLY LF SZ6.5 (GLOVE) ×2 IMPLANT
GLOVE SURG UNDER POLY LF SZ7 (GLOVE) ×2 IMPLANT
GLOVE SURG UNDER POLY LF SZ8 (GLOVE) ×3
GOWN STRL REUS W/ TWL LRG LVL3 (GOWN DISPOSABLE) ×2 IMPLANT
GOWN STRL REUS W/ TWL XL LVL3 (GOWN DISPOSABLE) ×1 IMPLANT
GOWN STRL REUS W/TWL LRG LVL3 (GOWN DISPOSABLE) ×12
GOWN STRL REUS W/TWL XL LVL3 (GOWN DISPOSABLE) ×3
GUIDEWIRE ENROUTE 0.014 (WIRE) ×3 IMPLANT
HEMOSTAT SNOW SURGICEL 2X4 (HEMOSTASIS) IMPLANT
INTRODUCER KIT GALT 7CM (INTRODUCER) ×3
KIT BASIN OR (CUSTOM PROCEDURE TRAY) ×3 IMPLANT
KIT ENCORE 26 ADVANTAGE (KITS) ×3 IMPLANT
KIT INTRODUCER GALT 7 (INTRODUCER) ×1 IMPLANT
KIT TURNOVER KIT B (KITS) ×3 IMPLANT
NDL HYPO 25GX1X1/2 BEV (NEEDLE) IMPLANT
NEEDLE HYPO 25GX1X1/2 BEV (NEEDLE) IMPLANT
PACK CAROTID (CUSTOM PROCEDURE TRAY) ×3 IMPLANT
POSITIONER HEAD DONUT 9IN (MISCELLANEOUS) ×3 IMPLANT
SET MICROPUNCTURE 5F STIFF (MISCELLANEOUS) IMPLANT
SPONGE T-LAP 18X18 ~~LOC~~+RFID (SPONGE) ×2 IMPLANT
STENT TRANSCAROTID SYSTEM 9X30 (Permanent Stent) ×2 IMPLANT
STENT TRANSCAROTID SYSTEM 9X40 (Permanent Stent) ×2 IMPLANT
SURGIFLO W/THROMBIN 8M KIT (HEMOSTASIS) ×2 IMPLANT
SUT PROLENE 5 0 C 1 24 (SUTURE) ×4 IMPLANT
SUT PROLENE 6 0 BV (SUTURE) IMPLANT
SUT SILK 2 0 PERMA HAND 18 BK (SUTURE) ×3 IMPLANT
SUT SILK 2 0 SH (SUTURE) ×3 IMPLANT
SUT VIC AB 3-0 SH 27 (SUTURE) ×3
SUT VIC AB 3-0 SH 27X BRD (SUTURE) ×2 IMPLANT
SUT VIC AB 4-0 PS2 27 (SUTURE) ×3 IMPLANT
SYR 10ML LL (SYRINGE) ×9 IMPLANT
SYR 20ML LL LF (SYRINGE) ×3 IMPLANT
SYR CONTROL 10ML LL (SYRINGE) IMPLANT
SYSTEM TRANSCAROTID NEUROPRTCT (MISCELLANEOUS) ×1 IMPLANT
TOWEL GREEN STERILE (TOWEL DISPOSABLE) ×3 IMPLANT
TRANSCAROTID NEUROPROTECT SYS (MISCELLANEOUS) ×3
WATER STERILE IRR 1000ML POUR (IV SOLUTION) ×3 IMPLANT
WIRE BENTSON .035X145CM (WIRE) ×3 IMPLANT

## 2021-10-23 NOTE — Progress Notes (Addendum)
This RN with attempt 9F insertion of foley, without difficulty to pass but no urine returned. Pt had been bleeding from Penis with voiding attempts prior and there was some clots with removal of that catheter.  42F foley then attempted without difficulty to pass. pink/ clots and clear urine returned. Patient tolerated insertion well. Pt states he has had a 42F foley in the past. 650 ML urine returned. Bed side RN updated. Kahliya Fraleigh, Bettina Gavia RN

## 2021-10-23 NOTE — Progress Notes (Signed)
2056: paged on call MD dr. Luan Pulling, pt's BP is staying below 120. Awaiting call back.  2144: paged MD again, MD called back, notified pt Bp has been staying below 120, in dopamine drip but we cannot titrate.BP seemed to trend down since he got some iv dilaudid for pain, and also threw up x 1. Also notified , pt is neuro intact and not in any distress, was dizzy earlier but resting now. Per MD will transfer to ICU. Will continue to monitor.

## 2021-10-23 NOTE — Anesthesia Procedure Notes (Signed)
Arterial Line Insertion Start/End12/28/2022 7:10 AM, 10/23/2021 7:20 AM Performed by: Nolon Nations, MD, CRNA  Right, radial was placed Catheter size: 20 G  Attempts: 1 Procedure performed without using ultrasound guided technique. Following insertion, dressing applied and Biopatch. Post procedure assessment: unchanged  Patient tolerated the procedure well with no immediate complications.

## 2021-10-23 NOTE — Transfer of Care (Signed)
Immediate Anesthesia Transfer of Care Note  Patient: Adam Flores  Procedure(s) Performed: LEFT TRANSCAROTID ARTERY REVASCULARIZATION (Left: Neck)  Patient Location: PACU  Anesthesia Type:General  Level of Consciousness: drowsy and patient cooperative  Airway & Oxygen Therapy: Patient Spontanous Breathing  Post-op Assessment: Report given to RN and Post -op Vital signs reviewed and stable  Post vital signs: Reviewed and stable  Last Vitals:  Vitals Value Taken Time  BP 97/74 10/23/21 0950  Temp    Pulse 71 10/23/21 0953  Resp 10 10/23/21 0953  SpO2 99 % 10/23/21 0953  Vitals shown include unvalidated device data.  Last Pain:  Vitals:   10/23/21 2072  TempSrc:   PainSc: 0-No pain         Complications: No notable events documented.

## 2021-10-23 NOTE — Progress Notes (Signed)
Got in touch with rapid response nurse about transferring patient, will call again after bed approves. Pt resting in bed. Will continue to monitor.

## 2021-10-23 NOTE — Anesthesia Postprocedure Evaluation (Signed)
Anesthesia Post Note  Patient: Adam Flores  Procedure(s) Performed: LEFT TRANSCAROTID ARTERY REVASCULARIZATION (Left: Neck)     Patient location during evaluation: PACU Anesthesia Type: General Level of consciousness: sedated and patient cooperative Pain management: pain level controlled Vital Signs Assessment: post-procedure vital signs reviewed and stable Respiratory status: spontaneous breathing Cardiovascular status: stable Anesthetic complications: no   No notable events documented.  Last Vitals:  Vitals:   10/23/21 1050 10/23/21 1105  BP: 111/73 124/73  Pulse: 95 87  Resp: 16 (!) 9  Temp:    SpO2: 92% 97%    Last Pain:  Vitals:   10/23/21 1050  TempSrc:   PainSc: [place]

## 2021-10-23 NOTE — Anesthesia Procedure Notes (Signed)
Procedure Name: Intubation Date/Time: 10/23/2021 7:55 AM Performed by: Georgia Duff, CRNA Pre-anesthesia Checklist: Patient identified, Emergency Drugs available, Suction available and Patient being monitored Patient Re-evaluated:Patient Re-evaluated prior to induction Oxygen Delivery Method: Circle System Utilized Preoxygenation: Pre-oxygenation with 100% oxygen Induction Type: IV induction Ventilation: Mask ventilation without difficulty Laryngoscope Size: Glidescope and 4 Grade View: Grade I Tube type: Oral Tube size: 7.5 mm Number of attempts: 1 Airway Equipment and Method: Stylet and Oral airway Placement Confirmation: ETT inserted through vocal cords under direct vision, positive ETCO2 and breath sounds checked- equal and bilateral Secured at: 23 cm Tube secured with: Tape Dental Injury: Teeth and Oropharynx as per pre-operative assessment

## 2021-10-23 NOTE — Progress Notes (Signed)
Pt arrived from PACU, VS and surgical sites stable, neuro WNL.  Attempting to void in urinal, standing at bedside, unable to start stream.  Very uncomfortable.   Received verbal order for foley overnight from vascular PA.

## 2021-10-23 NOTE — Significant Event (Signed)
Rapid Response Event Note   Reason for Call : Hypotension post CEA  Initial Focused Assessment:  Pt awake, alert and appropriate eating his dinner. No c/o pain or distress. Persistent hypotension SBP 60s-70s via Arterial liine and NIBP despite dopamine infusion at 47mcg/kg/min. NS bolus initiated. Orders received for levophed. Pt transitioned to levophed infusion and dopamine will be weaned to off. Pt transferred to [place].    Interventions:  -NS bolus 500cc now (SBP 66-70) -transition from Dopamine gtt at 59mcg/kg/min to Levophed at 93mcg/min -wean dopamine to off as levophed started.     MD Notified: prior to my arrival per staff RN Call Time: [id number] Arrival Time: 2215 End Time: 2300  Madelynn Done, RN

## 2021-10-23 NOTE — Progress Notes (Signed)
°  Progress Note    10/23/2021 4:26 PM Day of Surgery  Subjective:  throat sore   Vitals:   10/23/21 1416 10/23/21 1618  BP: 107/73 130/74  Pulse: 62 67  Resp: 11 16  Temp: 97.6 F (36.4 C) 97.6 F (36.4 C)  SpO2: 95% 99%   Physical Exam: Cardiac:  regular Lungs:  non labored Incisions:  left supraclavicular incision is c/d/I without swelling or hematoma Extremities: moving all extremities without deficits Neurologic: alert and oriented  CBC    Component Value Date/Time   WBC 5.7 10/17/2021 1320   RBC 5.15 10/17/2021 1320   HGB 13.8 10/17/2021 1320   HCT 43.3 10/17/2021 1320   PLT 176 10/17/2021 1320   MCV 84.1 10/17/2021 1320   MCH 26.8 10/17/2021 1320   MCHC 31.9 10/17/2021 1320   RDW 15.3 10/17/2021 1320    BMET    Component Value Date/Time   NA 139 10/17/2021 1320   K 3.9 10/17/2021 1320   CL 103 10/17/2021 1320   CO2 28 10/17/2021 1320   GLUCOSE 98 10/17/2021 1320   BUN 11 10/17/2021 1320   CREATININE 1.17 10/17/2021 1320   CALCIUM 9.6 10/17/2021 1320   GFRNONAA >60 10/17/2021 1320    INR    Component Value Date/Time   INR 0.9 10/17/2021 1320     Intake/Output Summary (Last 24 hours) at 10/23/2021 1626 Last data filed at 10/23/2021 1619 Gross per 24 hour  Intake 1800 ml  Output 1500 ml  Net 300 ml     Assessment/Plan:  80 y.o. male is s/p left TCAR Day of Surgery   Neurologically intact Left neck incision intact and well appearing Right groin without swelling or hematoma Requiring dopamine 5 mcg for BP support Plan to try to remove foley tomorrow morning for voiding challenge. Has known BPH so may need to keep foley and follow up with his Urologist as outpatient Anticipate discharge tomorrow if he continues to do well overnight   Karoline Caldwell, PA-C [facility] [phone] 10/23/2021 4:26 PM

## 2021-10-23 NOTE — Progress Notes (Signed)
PHARMACIST LIPID MONITORING   Adam Flores is a 80 y.o. male admitted on 10/23/2021 with carotid stenosis s/p TCAR.  Pharmacy has been consulted to optimize lipid-lowering therapy with the indication of secondary prevention for clinical ASCVD.  Recent Labs:  Lipid Panel (last 6 months):   No results found for: CHOL, TRIG, HDL, CHOLHDL, VLDL, LDLCALC, LDLDIRECT  Hepatic function panel (last 6 months):   Lab Results  Component Value Date   AST 30 10/17/2021   ALT 27 10/17/2021   ALKPHOS 65 10/17/2021   BILITOT 0.6 10/17/2021    SCr (since admission):   Serum creatinine: 1.17 mg/dL 10/17/21 1320 Estimated creatinine clearance: 44.3 mL/min  Current therapy and lipid therapy tolerance Current lipid-lowering therapy: rosuvastatin 10mg /d Previous lipid-lowering therapies (if applicable): n/a Documented or reported allergies or intolerances to lipid-lowering therapies (if applicable): n/a  Assessment:   Patient agrees with changes to lipid-lowering therapy  Plan:    1.Statin intensity (high intensity recommended for all patients regardless of the LDL):  Add or increase statin to high intensity.  2.Add ezetimibe (if any one of the following):   Not indicated at this time.  3.Refer to lipid clinic:   No  4.Follow-up with:  Primary care provider - Borum, Jaci Standard, MD  5.Follow-up labs after discharge:  Changes in lipid therapy were made. Check a lipid panel in 8-12 weeks then annually.      Arrie Senate, PharmD, BCPS, [facility] Clinical Pharmacist [phone] Please check AMION for all [place] numbers 10/23/2021

## 2021-10-23 NOTE — Op Note (Signed)
Patient name: Adam Flores MRN: [id number] DOB: 05-04-1941 Sex: male  10/23/2021 Pre-operative Diagnosis: Asymptomatic left carotid stenosis Post-operative diagnosis:  Same Surgeon:  Annamarie Major Assistants:  Ivin Booty, PA Procedure:   #1: Left carotid stent (TCAR)   #2: Retrograde flow reversal neuro protection   #3: Ultrasound-guided access, right common femoral vein Anesthesia: General Blood Loss: Minimal Specimens: None  Findings: 95% stenosis Predilatation balloon: 6 x 30 Postdilatation balloon: 7 x 30 Stent: ENROUTE 9 x 40, 9 x 30 Flow reversal time: 22 minutes  Indications: This is a 80 year old gentleman who was incidentally found to have a high-grade left carotid stenosis which is asymptomatic.  His bifurcation was very high and so I felt he would be best managed with TCAR.  Risks and benefits were discussed with the patient he wished to proceed.  Procedure:  The patient was identified in the holding area and taken to [place] 16  The patient was then placed supine on the table. general anesthesia was administered.  The patient was prepped and draped in the usual sterile fashion.  A time out was called and antibiotics were administered.  A PA was necessary to expedite procedure and assist with technical details.  Ultrasound was used to evaluate the right common femoral vein which was widely patent and easily compressible.  It was cannulated under ultrasound guidance with a micropuncture needle.  A [place] wire was advanced without resistance followed placement of a micropuncture sheath.  Next, a 035 wire was inserted and the TCAR sheath was placed.  Attention was then turned towards the left neck.  Ultrasound was used to evaluate the common carotid artery.  I made a transverse incision just above the clavicle.  Cautery was used divide subcutaneous tissue and platysma muscle.  Identified the carotid sheath and dissected out the internal jugular vein laterally.  The nerve was  not dissected out.  I then circumferentially exposed the common carotid artery and encircled it with a vessel loop and an umbilical tape.  The patient was fully heparinized.  A 5 Prolene U stitch was placed in the adventitia of the carotid artery.  I then cannulated the common carotid artery with a micropuncture needle.  A 018 wire was advanced up to the mark on the wire and a micropuncture sheath was advanced to 3 cm.  A contrast injection was then performed locating the carotid bifurcation which was marked on the screen.  Next a carotid Amplatz wire was inserted up to the mark and the TCAR sheath was placed.  The sheath was then sutured to the skin with 2 oh silks.  Flow reversal tubing was then connected and passive flow reversal was confirmed with a saline flush.  A TCAR timeout was then performed with appropriate ACT and blood pressure measurements.  Active flow reversal was then initiated by tightening the blue vessel loop.  Active flow reversal was confirmed with a saline flush.  Next a 018 wire was inserted with the support of the predilatation 6 x 30 balloon.  A contrast injection was performed to identify the anatomy and to confirm that there were no access site complications.  This also identified a 95% carotid stenosis.  The wire was then advanced into the internal carotid artery without difficulty.  I then predilated the lesion with a 6 x 30 balloon to 8 atm.  There was a slight heart rate response into the 40s.  I then selected a 9 x 40 stent.  This was deployed.  I did foreshorten because of the poststenotic dilatation.  I felt a second stent was necessary to fully exclude the lesion and so a 9 x 30 stent was inserted with appropriate overlap extending the stent coverage proximally.  I then postdilated the stents with a 7 x 30 balloon.  I waited 2 minutes and performed completion angiography which showed resolution of the stenosis.  The wire was then removed.  Active flow reversal was discontinued.   The flow reversal tubing was disconnected and blood was returned to the groin.  The TCAR sheath was removed and the 5-0 Prolene was used to secure the arteriotomy site.  Hand-held Doppler confirmed excellent flow within the carotid artery.  The patient's heparin was then reversed with 50 mg of protamine.  The sheath in the groin was removed and manual pressure was held for hemostasis.  The carotid incision was irrigated.  Hemostasis was achieved.  The platysma muscle was then reapproximated with 3-0 Vicryl and subcuticular closure of the skin was performed followed by Dermabond.  The patient was successfully extubated, found to be neurologically intact and was taken to recovery in stable condition.   Disposition: To PACU, neurologically intact and stable   V. Annamarie Major, M.D., [facility] [place] Office: [phone] Pager:  [phone]

## 2021-10-23 NOTE — Interval H&P Note (Signed)
History and Physical Interval Note:  10/23/2021 7:22 AM  Adam Flores  has presented today for surgery, with the diagnosis of carotid artery stenosis.  The various methods of treatment have been discussed with the patient and family. After consideration of risks, benefits and other options for treatment, the patient has consented to  Procedure(s): LEFT TRANSCAROTID ARTERY REVASCULARIZATION (Left) as a surgical intervention.  The patient's history has been reviewed, patient examined, no change in status, stable for surgery.  I have reviewed the patient's chart and labs.  Questions were answered to the patient's satisfaction.     Annamarie Major

## 2021-10-23 NOTE — Progress Notes (Signed)
During bedside report, pt had a episode of vomit x 1, trying to eat. When asked if he needs something for nausea , pt stated he feels better and wants to eat. Dopamine was running at 52mcg/kg/min, per report pt came to floor with that rate because 54mcg/kg/min was not keeping his BP up. So  pt stayed in that rate since transfer. Charge nurse made aware, suggested to leave that rate. Not allowed to titrate the drip per policy. BP has ben 109/58, 105/64. Pt said he was slightly lightheaded but not anymore. He had received IV dilaudid for pain around 1817. Neuro intact. Will continue to monitor.

## 2021-10-24 ENCOUNTER — Encounter (HOSPITAL_COMMUNITY): Payer: Self-pay | Admitting: Surgery

## 2021-10-24 DIAGNOSIS — Z9889 Other specified postprocedural states: Secondary | ICD-10-CM

## 2021-10-24 LAB — CBC
HCT: 35.6 % — ABNORMAL LOW (ref 39.0–52.0)
Hemoglobin: 11.2 g/dL — ABNORMAL LOW (ref 13.0–17.0)
MCH: 26.6 pg (ref 26.0–34.0)
MCHC: 31.5 g/dL (ref 30.0–36.0)
MCV: 84.6 fL (ref 80.0–100.0)
Platelets: 146 10*3/uL — ABNORMAL LOW (ref 150–400)
RBC: 4.21 MIL/uL — ABNORMAL LOW (ref 4.22–5.81)
RDW: 15.3 % (ref 11.5–15.5)
WBC: 8.1 10*3/uL (ref 4.0–10.5)
nRBC: 0 % (ref 0.0–0.2)

## 2021-10-24 LAB — BASIC METABOLIC PANEL
Anion gap: 8 (ref 5–15)
BUN: 9 mg/dL (ref 8–23)
CO2: 21 mmol/L — ABNORMAL LOW (ref 22–32)
Calcium: 7.8 mg/dL — ABNORMAL LOW (ref 8.9–10.3)
Chloride: 109 mmol/L (ref 98–111)
Creatinine, Ser: 1.19 mg/dL (ref 0.61–1.24)
GFR, Estimated: >60 mL/min (ref >60)
Glucose, Bld: 130 mg/dL — ABNORMAL HIGH (ref 70–99)
Potassium: 3.3 mmol/L — ABNORMAL LOW (ref 3.5–5.1)
Sodium: 138 mmol/L (ref 135–145)

## 2021-10-24 LAB — LIPID PANEL
Cholesterol: 110 mg/dL (ref 0–200)
HDL: 72 mg/dL (ref >40)
LDL Cholesterol: 34 mg/dL (ref 0–99)
Total CHOL/HDL Ratio: 1.5 RATIO
Triglycerides: 22 mg/dL (ref <150)
VLDL: 4 mg/dL (ref 0–40)

## 2021-10-24 LAB — POCT ACTIVATED CLOTTING TIME
Activated Clotting Time: 239 seconds
Activated Clotting Time: 245 seconds
Activated Clotting Time: 251 seconds

## 2021-10-24 MED ORDER — STERILE WATER FOR INJECTION IJ SOLN
INTRAMUSCULAR | Status: AC
  Administered 2021-10-24: 12:00:00 1 mL
  Filled 2021-10-24: qty 10

## 2021-10-24 MED ORDER — NITROGLYCERIN 2 % TD OINT
1.0000 [in_us] | TOPICAL_OINTMENT | Freq: Three times a day (TID) | TRANSDERMAL | Status: DC
  Administered 2021-10-25 (×2): 1 [in_us] via TOPICAL
  Filled 2021-10-24: qty 30

## 2021-10-24 MED ORDER — CHLORHEXIDINE GLUCONATE CLOTH 2 % EX PADS: 6.0000 | MEDICATED_PAD | Freq: Every day | CUTANEOUS | Status: DC

## 2021-10-24 MED ORDER — ENSURE ENLIVE PO LIQD
237.0000 mL | Freq: Two times a day (BID) | ORAL | Status: DC
  Administered 2021-10-24 – 2021-10-25 (×2): 237 mL via ORAL

## 2021-10-24 MED ORDER — POTASSIUM CHLORIDE CRYS ER 20 MEQ PO TBCR
40.0000 meq | EXTENDED_RELEASE_TABLET | Freq: Once | ORAL | Status: AC
  Administered 2021-10-24: 12:00:00 40 meq via ORAL
  Filled 2021-10-24: qty 2

## 2021-10-24 MED ORDER — DOPAMINE-DEXTROSE 3.2-5 MG/ML-% IV SOLN
5.0000 ug/kg/min | INTRAVENOUS | Status: DC
  Administered 2021-10-24: 1 ug/kg/min via INTRAVENOUS

## 2021-10-24 MED ORDER — PHENTOLAMINE MESYLATE 5 MG IJ SOLR
5.0000 mg | Freq: Once | INTRAMUSCULAR | Status: AC
  Administered 2021-10-24: 12:00:00 5 mg via SUBCUTANEOUS
  Filled 2021-10-24 (×2): qty 5

## 2021-10-24 NOTE — Progress Notes (Signed)
Initial Nutrition Assessment  DOCUMENTATION CODES:   Not applicable  INTERVENTION:   Ensure Enlive po BID, each supplement provides 350 kcal and 20 grams of protein   NUTRITION DIAGNOSIS:   Inadequate oral intake related to decreased appetite as evidenced by per patient/family report.  GOAL:   Patient will meet greater than or equal to 90% of their needs   MONITOR:   PO intake, Supplement acceptance, Labs, Weight trends  REASON FOR ASSESSMENT:   Malnutrition Screening Tool    ASSESSMENT:    80 yo male admitted with left carotid stenosis and underwent stent placement. PMH reviewed  12/28 L carotid stent (TCAR) placed  Unable to discharge today due to hypotension overnight requiring pressor. Currently on dopamine.   Appetite is fair, eating some but not a lot. No recorded po intake. Plan for ONS until appetite improves  Weight has been stable  Labs: potassium 3.3 (L) Meds: colace, KCl   Diet Order:   Diet Order             Diet Heart Room service appropriate? Yes; Fluid consistency: Thin  Diet effective now                   EDUCATION NEEDS:   Not appropriate for education at this time  Skin:  Skin Assessment: Reviewed RN Assessment  Last BM:  12/28  Height:   Ht Readings from Last 1 Encounters:  10/23/21 5\' 6"  (1.676 m)    Weight:   Wt Readings from Last 1 Encounters:  10/24/21 65 kg    BMI:  Body mass index is 23.13 kg/m.  Estimated Nutritional Needs:   Kcal:  1800-2000 kcals  Protein:  80-90 g  Fluid:  >/= 1.8 L  Kerman Passey MS, RDN, LDN, CNSC Registered Dietitian III Clinical Nutrition RD Pager and On-Call Pager Number Located in [place]

## 2021-10-24 NOTE — Progress Notes (Signed)
Infiltration of levophed in PIV on right forearm noted upon assessment. Medication switched to access in left arm. Infiltrated IV aspirated and removed. Hot pack placed and extremity elevated. Provider and pharmacy notified for appropriate medication interventions.

## 2021-10-24 NOTE — Progress Notes (Addendum)
°  Progress Note    10/24/2021 7:08 AM 1 Day Post-Op  Subjective:  frustrated about not going home today.  Denies trouble swallowing.   Afebrile 62'B-762'G systolic HR 31'D-17'O NSR 98% RA  Gtts: Dopamine Levophed   Vitals:   10/24/21 0630 10/24/21 0645  BP: 111/76 105/64  Pulse: 67 85  Resp: 13 12  Temp:    SpO2: 97% 96%     Physical Exam: Neuro:  in tact; moving all extremities equally Lungs:  non labored Incision:  left neck is clean and dry without hematoma; right groin soft.   CBC    Component Value Date/Time   WBC 8.1 10/24/2021 0347   RBC 4.21 (L) 10/24/2021 0347   HGB 11.2 (L) 10/24/2021 0347   HCT 35.6 (L) 10/24/2021 0347   PLT 146 (L) 10/24/2021 0347   MCV 84.6 10/24/2021 0347   MCH 26.6 10/24/2021 0347   MCHC 31.5 10/24/2021 0347   RDW 15.3 10/24/2021 0347    BMET    Component Value Date/Time   NA 138 10/24/2021 0347   K 3.3 (L) 10/24/2021 0347   CL 109 10/24/2021 0347   CO2 21 (L) 10/24/2021 0347   GLUCOSE 130 (H) 10/24/2021 0347   BUN 9 10/24/2021 0347   CREATININE 1.19 10/24/2021 0347   CALCIUM 7.8 (L) 10/24/2021 0347   GFRNONAA >60 10/24/2021 0347     Intake/Output Summary (Last 24 hours) at 10/24/2021 0708 Last data filed at 10/24/2021 0626 Gross per 24 hour  Intake 1927.95 ml  Output 2270 ml  Net -342.05 ml     Assessment/Plan:  This is a 80 y.o. male who is s/p left TCAR 1 Day Post-Op  -pt is neurologically in tact but was hypotensive yesterday requiring dopamine.  He further required Leovphed and was transferred to ICU.  He has not ambulated as when the nursing staff went to stand him up, his MAP dropped to the 30's.   Continue to wean gtts as tolerated.  Will need to stay in ICU until off pressor support.  -acute blood loss anemia-hgb this am is 11.2 down from 13.8 pre op. -continue to try to mobilize as tolerated.  Addendum:  spoke with RN and A-line is unreliable.  BP cuff consistent on leg and arm, therefore will go by  cuff pressures to wean gtts.     Leontine Locket, PA-C [facility] [phone]   I agree with the above.  Neuro intact.  Having issues with hypotension, on Levophed and dopa. Baseline SBP is 100.  Will titrate meds to off for MAP of 70. D/c a-line Home when BP issues resolved   Annamarie Major

## 2021-10-24 NOTE — Progress Notes (Signed)
An USGPIV (ultrasound guided PIV) has been placed for short-term vasopressor infusion on Proximal Lt Forearm  A correctly placed ivWatch must be used when administering Vasopressors. Should this treatment be needed beyond 72 hours, central line access should be obtained.  It will be the responsibility of the bedside nurse to follow best practice to prevent extravasations. RN Aware.

## 2021-10-25 NOTE — Progress Notes (Signed)
Discharge instructions given to and reviewed with patient. Patient verbalized understanding of all discharge instructions including follow up care, activity restrictions, and when to call the doctor if needed. Patient ambulated to main entrance(per his preference) with this RN and left with friend driving.

## 2021-10-25 NOTE — Progress Notes (Signed)
Patient has voided twice since foley removal. After most recent void, RN performed post void bladder scan and result 21 cc.

## 2021-10-25 NOTE — Progress Notes (Signed)
[facility] [place]  Subjective  - ambulating in room   Objective 98/61 70 98.8 F (37.1 C) (Oral) 15 96%  Intake/Output Summary (Last 24 hours) at 10/25/2021 0709 Last data filed at 10/25/2021 0200 Gross per 24 hour  Intake 465.76 ml  Output 900 ml  Net -434.24 ml    Remains hypotensive 53'M to 104'U systolic off pressors Moving all 4 extremities Left neck incision healing well without hematoma Lungs non labored breathing  Assessment/Planning: POD #2 Left TCAR  Hypotension @ baseline in the low 459'P systolic.  Off pressors since 10 pm 10/24/21.  MAP 70.  Asymptomatic. Good urine Op, ambulating in room Appears stable plan for discharge home D/C foley pending independent void prior to discharge F/U in 2-3 weeks for incision check   Roxy Horseman 10/25/2021 7:09 AM --  Laboratory Lab Results: Recent Labs    10/23/21 1400 10/24/21 0347  WBC 7.7 8.1  HGB 12.4* 11.2*  HCT 37.5* 35.6*  PLT 157 146*   BMET Recent Labs    10/23/21 1400 10/24/21 0347  NA  --  138  K  --  3.3*  CL  --  109  CO2  --  21*  GLUCOSE  --  130*  BUN  --  9  CREATININE 1.08 1.19  CALCIUM  --  7.8*    COAG Lab Results  Component Value Date   INR 0.9 10/17/2021   No results found for: PTT

## 2021-10-30 NOTE — Discharge Summary (Signed)
[facility] Discharge Summary   Patient ID:  Adam Flores MRN: [id number] DOB/AGE: 11/26/1940 81 y.o.  Admit date: 10/23/2021 Discharge date: 10/25/21 Date of Surgery: 10/23/2021 Surgeon: Surgeon(s): Serafina Mitchell, MD  Admission Diagnosis: Carotid stenosis, asymptomatic, left [I65.22]  Discharge Diagnoses:  Carotid stenosis, asymptomatic, left [I65.22]  Secondary Diagnoses: Past Medical History:  Diagnosis Date   Asthma    BPH (benign prostatic hyperplasia)    Carotid stenosis    Colon polyps    IgA monoclonal gammopathy of uncertain significance    Prurigo nodularis    Right bundle branch block     Procedure(s): LEFT TRANSCAROTID ARTERY REVASCULARIZATION  Discharged Condition: good  HPI:  81 y/o male with history of asymptomatic carotid stenosis.   CT angiogram that shows a high-grade, greater than 80% left-sided stenosis.  His bifurcation is high up near the angle of the mandible.  No significant right-sided stenosis  Hospital Course:  Adam Flores is a 81 y.o. male is S/P Left Procedure(s): LEFT TRANSCAROTID ARTERY REVASCULARIZATION He was hypotensive post op and was admitted to [facility] with dopamine infusion at 11mcg/kg/min.  He was then placed on Levophed at 73mcg/min and the Dopamine was discontinued.  No neurologic deficits.  Incision healing well without hematoma.   Significant Diagnostic Studies: CBC Lab Results  Component Value Date   WBC 8.1 10/24/2021   HGB 11.2 (L) 10/24/2021   HCT 35.6 (L) 10/24/2021   MCV 84.6 10/24/2021   PLT 146 (L) 10/24/2021    BMET    Component Value Date/Time   NA 138 10/24/2021 0347   K 3.3 (L) 10/24/2021 0347   CL 109 10/24/2021 0347   CO2 21 (L) 10/24/2021 0347   GLUCOSE 130 (H) 10/24/2021 0347   BUN 9 10/24/2021 0347   CREATININE 1.19 10/24/2021 0347   CALCIUM 7.8 (L) 10/24/2021 0347   GFRNONAA >60 10/24/2021 0347   COAG Lab Results  Component Value Date   INR 0.9 10/17/2021      Disposition:  Discharge to :Home Discharge Instructions     Call MD for:  redness, tenderness, or signs of infection (pain, swelling, bleeding, redness, odor or green/yellow discharge around incision site)   Complete by: As directed    Call MD for:  severe or increased pain, loss or decreased feeling  in affected limb(s)   Complete by: As directed    Call MD for:  temperature >100.5   Complete by: As directed    Resume previous diet   Complete by: As directed       Allergies as of 10/25/2021   No Known Allergies      Medication List     TAKE these medications    albuterol 108 (90 Base) MCG/ACT inhaler Commonly known as: VENTOLIN HFA Inhale 1-2 puffs into the lungs every 6 (six) hours as needed for shortness of breath or wheezing.   aspirin 81 MG EC tablet Take 81 mg by mouth daily.   CENTRUM SILVER 50+MEN PO Take 1 tablet by mouth daily.   clopidogrel 75 MG tablet Commonly known as: Plavix Take 1 tablet (75 mg total) by mouth daily.   DSS 100 MG Caps Take 100 mg by mouth every Monday, Wednesday, and Friday.   finasteride 5 MG tablet Commonly known as: PROSCAR Take 5 mg by mouth daily.   fluticasone-salmeterol 100-50 MCG/ACT Aepb Commonly known as: ADVAIR Inhale 1 puff into the lungs at bedtime.   folic acid 1 MG tablet Commonly known as: FOLVITE Take  1 mg by mouth daily.   methotrexate 2.5 MG tablet Commonly known as: RHEUMATREX Take 2.5 mg by mouth once a week.   Omega-3 1000 MG Caps Take 1,000 mg by mouth daily.   POTASSIUM PO Take 1 tablet by mouth daily.   rosuvastatin 10 MG tablet Commonly known as: Crestor Take 1 tablet (10 mg total) by mouth daily.   SAW PALMETTO PO Take 1 capsule by mouth daily.   tamsulosin 0.4 MG Caps capsule Commonly known as: FLOMAX Take 0.4 mg by mouth daily.   triamcinolone ointment 0.1 % Commonly known as: KENALOG Apply 1 application topically daily as needed (itching).   VITAMIN D3 PO Take 1  tablet by mouth daily.       Verbal and written Discharge instructions given to the patient. Wound care per Discharge AVS  Follow-up Information     Serafina Mitchell, MD Follow up in 3 week(s).   Specialties: Vascular Surgery, Cardiology Why: Office will call you to arrange your appt (sent) Contact information: [place] [id number] [address], [facility] Follow up.   Specialty: [place] Why: Latricia Heft)- pre-op referral from Vascular office for [facility] needs- they will call you post discharge regarding visits Contact information: [place] [address] [phone]                 Signed: Roxy Horseman 10/30/2021, 11:22 AM --- For VQI Registry use --- Instructions: Press F2 to tab through selections.  Delete question if not applicable.   Modified Rankin score at D/C (0-6): Rankin Score=0  IV medication needed for:  1. Hypertension: No 2. Hypotension: Yes  Post-op Complications: No  1. Post-op CVA or TIA: No  If yes: Event classification (right eye, left eye, right cortical, left cortical, verterobasilar, other):   If yes: Timing of event (intra-op, <6 hrs post-op, >=6 hrs post-op, unknown):   2. CN injury: No  If yes: CN  injuried   3. Myocardial infarction: No  If yes: Dx by (EKG or clinical, Troponin):   4.  CHF: No  5.  Dysrhythmia (new): No  6. Wound infection: No  7. Reperfusion symptoms: No  8. Return to OR: No  If yes: return to OR for (bleeding, neurologic, other CEA incision, other):   Discharge medications: Statin use:  Yes ASA use:  Yes Beta blocker use:  No  for medical reason not indicated ACE-Inhibitor use:  No  for medical reason not indicated P2Y12 Antagonist use: [ ]  None, [ x] Plavix, [ ]  Plasugrel, [ ]  Ticlopinine, [ ]  Ticagrelor, [ ]  Other, [ ]  No for medical reason, [ ]  Non-compliant, [ ]  Not-indicated Anti-coagulant use:  [x ] None, [ ]  Warfarin, [ ]   Rivaroxaban, [ ]  Dabigatran, [ ]  Other, [ ]  No for medical reason, [ ]  Non-compliant, [ ]  Not-indicated

## 2021-11-08 ENCOUNTER — Telehealth: Payer: Self-pay | Admitting: *Deleted

## 2021-11-08 NOTE — Telephone Encounter (Signed)
Olivia Mackie Nurse form [facility] called to let us know they are having difficulty scheduling Adam Flores visits with patient she states he is making excuses every time they have tried to set up an appt. She wanted to let us know.

## 2021-11-18 ENCOUNTER — Other Ambulatory Visit: Payer: Self-pay

## 2021-11-18 ENCOUNTER — Ambulatory Visit (INDEPENDENT_AMBULATORY_CARE_PROVIDER_SITE_OTHER): Payer: Medicare Other | Admitting: Physician Assistant

## 2021-11-18 VITALS — BP 133/92 | HR 86 | Temp 98.4°F | Resp 18 | Ht 66.0 in | Wt 139.0 lb

## 2021-11-18 DIAGNOSIS — I6522 Occlusion and stenosis of left carotid artery: Secondary | ICD-10-CM

## 2021-11-18 NOTE — Progress Notes (Signed)
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  s/p left TCAR for asymptomatic carotid stenosis on 10/23/21 by Dr. Trula Slade.    Pt returns today for follow up.  Pt states he denies amaurosis, aphasia or weakness. He is on daily ASA, Plavix and Crestor.    No Known Allergies  Current Outpatient Medications  Medication Sig Dispense Refill   albuterol (VENTOLIN HFA) 108 (90 Base) MCG/ACT inhaler Inhale 1-2 puffs into the lungs every 6 (six) hours as needed for shortness of breath or wheezing.     aspirin 81 MG EC tablet Take 81 mg by mouth daily.     Cholecalciferol (VITAMIN D3 PO) Take 1 tablet by mouth daily.     clopidogrel (PLAVIX) 75 MG tablet Take 1 tablet (75 mg total) by mouth daily. 30 tablet 11   Docusate Sodium (DSS) 100 MG CAPS Take 100 mg by mouth every Monday, Wednesday, and Friday.     finasteride (PROSCAR) 5 MG tablet Take 5 mg by mouth daily.     fluticasone-salmeterol (ADVAIR) 100-50 MCG/ACT AEPB Inhale 1 puff into the lungs at bedtime.     folic acid (FOLVITE) 1 MG tablet Take 1 mg by mouth daily.     methotrexate (RHEUMATREX) 2.5 MG tablet Take 2.5 mg by mouth once a week.     Multiple Vitamins-Minerals (CENTRUM SILVER 50+MEN PO) Take 1 tablet by mouth daily.     Omega-3 1000 MG CAPS Take 1,000 mg by mouth daily.     POTASSIUM PO Take 1 tablet by mouth daily.     rosuvastatin (CRESTOR) 10 MG tablet Take 1 tablet (10 mg total) by mouth daily. 30 tablet 12   Saw Palmetto, Serenoa repens, (SAW PALMETTO PO) Take 1 capsule by mouth daily.     tamsulosin (FLOMAX) 0.4 MG CAPS capsule Take 0.4 mg by mouth daily.     triamcinolone ointment (KENALOG) 0.1 % Apply 1 application topically daily as needed (itching).     No current facility-administered medications for this visit.     ROS:  See HPI  Physical Exam:    Incision:  Left neck incision healing well without  hematoma Extremities:  Grip 5/5, palpable radial pulses B, no facial droop and no tongue deviation Heart RRR Lungs non labored breathing   Assessment/Plan:  This is a 81 y.o. male who is s/p: TCAR for asymptomatic > 80 % ICA stenosis on the left  He may return to activity as tolerates without restrictions.  Cont ASA, Plavix and Crestor.  He will return in 6 months for carotid duplex B.  If he has problems or concerns he will call.  We will discontinue the dual antiplatelet at his next visit.     Roxy Horseman PA-C [facility] [phone]   Clinic MD:  Trula Slade

## 2021-11-20 ENCOUNTER — Other Ambulatory Visit: Payer: Self-pay | Admitting: *Deleted

## 2021-11-20 DIAGNOSIS — I6522 Occlusion and stenosis of left carotid artery: Secondary | ICD-10-CM

## 2021-12-29 NOTE — Progress Notes (Signed)
Cardiology Office Note:    Date:  12/30/2021   ID:  Adam Flores, DOB June 23, 1941, MRN [id number]  PCP:  Charlsie Merles, MD  Cardiologist:  None   Referring MD: Charlsie Merles, MD   Chief Complaint  Patient presents with   Follow-up    Carotid disease status post left TCAR Family history CVA Right bundle branch block    History of Present Illness:    Adam Flores is a 81 y.o. male with a hx of asthma, carotid stenosis s/p Left TCAR 10/23/2021, MGUS, and RBBB.   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  He was subsequently seen by Dr. Trula Slade, underwent left carotid TCAR in December.  He has done well and had no neurological complications.  He did have profound systolic blood pressure reduction and required Levophed.  Blood pressure gradually improved.  The low blood pressure did require a longer hospital stay.  He denies other vascular complaints including angina, orthopnea, PND, palpitations, syncope, claudication, and transient neurological symptoms.  Father died of a stroke in his 47s.  [name] had the same fate.  Both smoked cigarettes.  Dr. Earnie Larsson is never smoked.  There is no history of hypertension, diabetes, or other problems.  Past Medical History:  Diagnosis Date   Asthma    BPH (benign prostatic hyperplasia)    Carotid stenosis    Colon polyps    IgA monoclonal gammopathy of uncertain significance    Prurigo nodularis    Right bundle branch block     Past Surgical History:  Procedure Laterality Date   TONSILLECTOMY     TRANSCAROTID ARTERY REVASCULARIZATION  Left 10/23/2021   Procedure: LEFT TRANSCAROTID ARTERY REVASCULARIZATION;  Surgeon: Serafina Mitchell, MD;  Location: [facility] OR;  Service: Vascular;  Laterality: Left;    Current Medications: Current Meds  Medication Sig   albuterol  (VENTOLIN HFA) 108 (90 Base) MCG/ACT inhaler Inhale 1-2 puffs into the lungs every 6 (six) hours as needed for shortness of breath or wheezing.   aspirin 81 MG EC tablet Take 81 mg by mouth daily.   Cholecalciferol (VITAMIN D3 PO) Take 1 tablet by mouth daily.   clopidogrel (PLAVIX) 75 MG tablet Take 1 tablet (75 mg total) by mouth daily.   Docusate Sodium (DSS) 100 MG CAPS Take 100 mg by mouth every Monday, Wednesday, and Friday.   finasteride (PROSCAR) 5 MG tablet Take 5 mg by mouth daily.   fluticasone-salmeterol (ADVAIR) 100-50 MCG/ACT AEPB Inhale 1 puff into the lungs at bedtime.   methotrexate (RHEUMATREX) 2.5 MG tablet Take 2.5 mg by mouth once a week.   Multiple Vitamins-Minerals (CENTRUM SILVER 50+MEN PO) Take 1 tablet by mouth daily.   Omega-3 1000 MG CAPS Take 1,000 mg by mouth daily.   POTASSIUM PO Take 1 tablet by mouth daily.   rosuvastatin (CRESTOR) 10 MG tablet Take 1 tablet (10 mg total) by mouth daily.   Saw Palmetto, Serenoa repens, (SAW PALMETTO PO) Take 1 capsule by mouth daily.   tadalafil (CIALIS) 20 MG tablet Take 1 tablet by mouth as needed.   tamsulosin (FLOMAX) 0.4 MG CAPS capsule Take 0.4 mg by mouth daily.   triamcinolone ointment (KENALOG) 0.1 % Apply 1 application topically daily as needed (itching).     Allergies:   Patient has no known allergies.   Social History  Socioeconomic History   Marital status: Married    Spouse name: Not on file   Number of children: 0   Years of education: Not on file   Highest education level: Not on file  Occupational History   Occupation: radiologist    Comment: retired  Tobacco Use   Smoking status: Never   Smokeless tobacco: Never  Vaping Use   Vaping Use: Never used  Substance and Sexual Activity   Alcohol use: Yes    Alcohol/week: 0.0 standard drinks    Comment: social   Drug use: No   Sexual activity: Not on file  Other Topics Concern   Not on file  Social History Narrative   Not on file   Social  Determinants of Health   Financial Resource Strain: Not on file  Food Insecurity: Not on file  Transportation Needs: Not on file  Physical Activity: Not on file  Stress: Not on file  Social Connections: Not on file     Family History: The patient's family history includes Congestive Heart Failure in his mother; Hypertension in his sister; Prostate cancer in his brother; Stroke in his father.  ROS:   Please see the history of present illness.    He has had no bleeding or issues on current therapy that includes DAPT.  All other systems reviewed and are negative.  EKGs/Labs/Other Studies Reviewed:    The following studies were reviewed today: TCAR vascular procedure 10/23/2021: Findings: 95% stenosis Predilatation balloon: 6 x 30 Postdilatation balloon: 7 x 30 Stent: ENROUTE 9 x 40, 9 x 30 Flow reversal time: 22 minutes EKG:  EKG sinus rhythm, right bundle branch block, normal PR interval, with biatrial abnormality.  With EKG performed October 17, 2021, heart rate is fast on today's tracing.  Recent Labs: 10/17/2021: ALT 27 10/24/2021: BUN 9; Creatinine, Ser 1.19; Hemoglobin 11.2; Platelets 146; Potassium 3.3; Sodium 138  Recent Lipid Panel    Component Value Date/Time   CHOL 110 10/24/2021 0350   TRIG 22 10/24/2021 0350   HDL 72 10/24/2021 0350   CHOLHDL 1.5 10/24/2021 0350   VLDL 4 10/24/2021 0350   LDLCALC 34 10/24/2021 0350    Physical Exam:    VS:  BP 108/70    Pulse 87    Ht '5\' 6"'$  (1.676 m)    Wt 146 lb 12.8 oz (66.6 kg)    SpO2 96%    BMI 23.69 kg/m     Wt Readings from Last 3 Encounters:  12/30/21 146 lb 12.8 oz (66.6 kg)  11/18/21 139 lb (63 kg)  10/24/21 143 lb 4.8 oz (65 kg)     GEN: Slender.  Appears younger than stated age.. No acute distress HEENT: Normal NECK: No JVD. LYMPHATICS: No lymphadenopathy CARDIAC: Soft left lower sternal systolic murmur. RRR S4 but no S3 gallop, or edema. VASCULAR:  Normal Pulses. No bruits. RESPIRATORY:  Clear to  auscultation without rales, wheezing or rhonchi  ABDOMEN: Soft, non-tender, non-distended, No pulsatile mass, MUSCULOSKELETAL: No deformity  SKIN: Warm and dry NEUROLOGIC:  Alert and oriented x 3 PSYCHIATRIC:  Normal affect   ASSESSMENT:    1. Carotid stenosis, asymptomatic, left   2. On statin therapy   3. RBBB   4. Mild intermittent asthma without complication    PLAN:    In order of problems listed above:  Continue dual antiplatelet therapy until otherwise instructed by Dr. Trula Slade. Continue moderate intensity statin therapy for pleiotropic effects. Chronic without any associated cardiovascular problems or symptoms. Quiescent.  Overall education and awareness concerning secondary risk prevention was discussed in detail: LDL less than 70, hemoglobin A1c less than 7, blood pressure target less than 130/80 mmHg, >150 minutes of moderate aerobic activity per week, avoidance of smoking, weight control (via diet and exercise), and continued surveillance/management of/for obstructive sleep apnea.  Recommend abdominal ultrasound at the [facility] to be certain abdominal aortic aneurysm is not present.  Cardiovascular surveillance appointment for 1 year.    Medication Adjustments/Labs and Tests Ordered: Current medicines are reviewed at length with the patient today.  Concerns regarding medicines are outlined above.  Orders Placed This Encounter  Procedures   EKG 12-Lead   No orders of the defined types were placed in this encounter.   Patient Instructions  Medication Instructions:  Your physician recommends that you continue on your current medications as directed. Please refer to the Current Medication list given to you today.  *If you need a refill on your cardiac medications before your next appointment, please call your pharmacy*   Lab Work: None If you have labs (blood work) drawn today and your tests are completely normal, you will receive your results only  by: [place] (if you have MyChart) OR A paper copy in the mail If you have any lab test that is abnormal or we need to change your treatment, we will call you to review the results.   Testing[facility]Procedures: [facility]   Follow-Up: At [facility], you and your health needs are our priority.  As part of our continuing mission to provide you with exceptional heart care, we have created designated Provider Care Teams.  These Care Teams include your primary Cardiologist (physician) and Advanced Practice Providers (APPs -  Physician Assistants and Nurse Practitioners) who all work together to provide you with the care you need, when you need it.  We recommend signing up for the patient portal called "MyChart".  Sign up information is provided on this After Visit Summary.  MyChart is used to connect with patients for Virtual Visits (Telemedicine).  Patients are able to view lab/test results, encounter notes, upcoming appointments, etc.  Non-urgent messages can be sent to your provider as well.   To learn more about what you can do with MyChart, go to [email or web address].    Your next appointment:   1 year(s)  The format for your next appointment:   In Person  Provider:   [facility]. Blenda Bridegroom, MD    Other Instructions     Signed, Sinclair Grooms, MD  12/30/2021 2:50 PM    [place]

## 2021-12-30 ENCOUNTER — Other Ambulatory Visit: Payer: Self-pay

## 2021-12-30 ENCOUNTER — Encounter: Payer: Self-pay | Admitting: Interventional Cardiology

## 2021-12-30 ENCOUNTER — Ambulatory Visit (INDEPENDENT_AMBULATORY_CARE_PROVIDER_SITE_OTHER): Payer: Medicare Other | Admitting: Interventional Cardiology

## 2021-12-30 VITALS — BP 108/70 | HR 87 | Ht 66.0 in | Wt 146.8 lb

## 2021-12-30 DIAGNOSIS — J4520 Mild intermittent asthma, uncomplicated: Secondary | ICD-10-CM

## 2021-12-30 DIAGNOSIS — I4510 Unspecified right bundle-branch block: Secondary | ICD-10-CM

## 2021-12-30 DIAGNOSIS — I6522 Occlusion and stenosis of left carotid artery: Secondary | ICD-10-CM | POA: Diagnosis not present

## 2021-12-30 DIAGNOSIS — Z79899 Other long term (current) drug therapy: Secondary | ICD-10-CM | POA: Diagnosis not present

## 2021-12-30 NOTE — Patient Instructions (Signed)
Medication Instructions:  ?Your physician recommends that you continue on your current medications as directed. Please refer to the Current Medication list given to you today. ? ?*If you need a refill on your cardiac medications before your next appointment, please call your pharmacy* ? ? ?Lab Work: ?None ?If you have labs (blood work) drawn today and your tests are completely normal, you will receive your results only by: ?MyChart Message (if you have MyChart) OR ?A paper copy in the mail ?If you have any lab test that is abnormal or we need to change your treatment, we will call you to review the results. ? ? ?Testing/Procedures: ?[facility] about [facility] ? ? ?Follow-Up: ?At [facility], you and your health needs are our priority.  As part of our continuing mission to provide you with exceptional heart care, we have created designated Provider Care Teams.  These Care Teams include your primary Cardiologist (physician) and Advanced Practice Providers (APPs -  Physician Assistants and Nurse Practitioners) who all work together to provide you with the care you need, when you need it. ? ?We recommend signing up for the patient portal called "MyChart".  Sign up information is provided on this After Visit Summary.  MyChart is used to connect with patients for Virtual Visits (Telemedicine).  Patients are able to view lab/test results, encounter notes, upcoming appointments, etc.  Non-urgent messages can be sent to your provider as well.   ?To learn more about what you can do with MyChart, go to [email or web address].   ? ?Your next appointment:   ?1 year(s) ? ?The format for your next appointment:   ?In Person ? ?Provider:   ?[facility]. Blenda Bridegroom, MD  ? ? ?Other Instructions ?  ?

## 2022-02-11 ENCOUNTER — Other Ambulatory Visit: Payer: Self-pay

## 2022-02-11 ENCOUNTER — Telehealth: Payer: Self-pay

## 2022-02-11 NOTE — Telephone Encounter (Signed)
Patient says he had a Left ICA Stent placed in 2022.  He is on ASA, Plavix, and Crestor.  Should he be on antibiotics for upcoming dental surgery?  Per [place], [place], no to antibiotics, have dentist send a clearance letter for [place] to sign.  Patient aware and verbalized understanding.  ?

## 2022-06-16 ENCOUNTER — Ambulatory Visit: Payer: Medicare Other

## 2022-06-16 ENCOUNTER — Ambulatory Visit (HOSPITAL_COMMUNITY)
Admission: RE | Admit: 2022-06-16 | Discharge: 2022-06-16 | Disposition: A | Discharge status: Home / Self Care | Payer: Medicare Other | Source: Ambulatory Visit | Attending: Surgery | Admitting: Surgery

## 2022-06-16 DIAGNOSIS — I6522 Occlusion and stenosis of left carotid artery: Secondary | ICD-10-CM | POA: Insufficient documentation

## 2022-07-07 ENCOUNTER — Ambulatory Visit (INDEPENDENT_AMBULATORY_CARE_PROVIDER_SITE_OTHER): Payer: Medicare Other | Admitting: Physician Assistant

## 2022-07-07 VITALS — BP 129/86 | HR 72 | Temp 97.8°F | Resp 16 | Ht 67.0 in | Wt 146.0 lb

## 2022-07-07 DIAGNOSIS — I6523 Occlusion and stenosis of bilateral carotid arteries: Secondary | ICD-10-CM | POA: Diagnosis not present

## 2022-07-07 NOTE — Progress Notes (Signed)
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  He is s/p left TCAR for asymptomatic carotid stenosis greater than 80% on 10/23/2021 by Dr. Trula Slade.  At his last visit he was not having any issues.  At that time he was taking aspirin, Plavix, Crestor.  He does not have any history of TIA or stroke symptoms.  He denies amaurosis fugax or monocular blindness.  He denies any facial drooping, hemiplegia, receptive or expressive aphasia.  He states that he occasionally can feel his heartbeat in his left ear, and this has been happening for a week or 2.  He is wondering if this has anything to do with his surgery.  He denies any changes to his hearing. Current Outpatient Medications  Medication Sig Dispense Refill   albuterol (VENTOLIN HFA) 108 (90 Base) MCG/ACT inhaler Inhale 1-2 puffs into the lungs every 6 (six) hours as needed for shortness of breath or wheezing.     alfuzosin (UROXATRAL) 10 MG 24 hr tablet Take 1 tablet by mouth daily.     aspirin 81 MG EC tablet Take 81 mg by mouth daily.     Cholecalciferol (VITAMIN D3 PO) Take 1 tablet by mouth daily.     clopidogrel (PLAVIX) 75 MG tablet Take 1 tablet (75 mg total) by mouth daily. 30 tablet 11   finasteride (PROSCAR) 5 MG tablet Take 5 mg by mouth daily.     methotrexate (RHEUMATREX) 2.5 MG tablet Take 2.5 mg by mouth once a week.     Multiple Vitamins-Minerals (CENTRUM SILVER 50+MEN PO) Take 1 tablet by mouth daily.     Omega-3 1000 MG CAPS Take 1,000 mg by mouth daily.     POTASSIUM PO Take 1 tablet by mouth daily.     rosuvastatin (CRESTOR) 10 MG tablet Take 1 tablet (10 mg total) by mouth daily. 30 tablet 12   Saw Palmetto, Serenoa repens, (SAW PALMETTO PO) Take 1 capsule by mouth daily.     tadalafil (CIALIS) 20 MG tablet Take 1 tablet by mouth as needed.     tamsulosin (FLOMAX) 0.4 MG CAPS capsule Take 0.4 mg by mouth daily.      triamcinolone ointment (KENALOG) 0.1 % Apply 1 application topically daily as needed (itching).     fluticasone-salmeterol (ADVAIR) 100-50 MCG/ACT AEPB Inhale 1 puff into the lungs at bedtime.     folic acid (FOLVITE) 1 MG tablet Take 1 mg by mouth daily. (Patient not taking: Reported on 12/30/2021)     No current facility-administered medications for this visit.    REVIEW OF SYSTEMS (negative unless checked):   Cardiac:  '[]'$  Chest pain or chest pressure? '[]'$  Shortness of breath upon activity? '[]'$  Shortness of breath when lying flat? '[]'$  Irregular heart rhythm?  Vascular:  '[]'$  Pain in calf, thigh, or hip brought on by walking? '[]'$  Pain in feet at night that wakes you up from your sleep? '[]'$  Blood clot in your veins? '[]'$  Leg swelling?  Pulmonary:  '[]'$  Oxygen at home? '[]'$  Productive cough? '[]'$  Wheezing?  Neurologic:  '[]'$  Sudden weakness in arms or legs? '[]'$  Sudden numbness in arms or legs? '[]'$  Sudden onset of difficult speaking or slurred speech? '[]'$  Temporary loss of vision in one eye? '[]'$  Problems with dizziness?  Gastrointestinal:  '[]'$  Blood in stool? '[]'$  Vomited blood?  Genitourinary:  '[]'$  Burning when urinating? '[]'$  Blood in urine?  Psychiatric:  '[]'$   Major depression  Hematologic:  '[]'$  Bleeding problems? '[]'$  Problems with blood clotting?  Dermatologic:  '[]'$  Rashes or ulcers?  Constitutional:  '[]'$  Fever or chills?  Ear/Nose/Throat:  '[]'$  Change in hearing? '[]'$  Nose bleeds? '[]'$  Sore throat?  Musculoskeletal:  '[]'$  Back pain? '[]'$  Joint pain? '[]'$  Muscle pain?   Physical Examination   Vitals:   07/07/22 0904 07/07/22 0908  BP: 132/88 129/86  Pulse: 73 72  Resp: 16   Temp: 97.8 F (36.6 C)   TempSrc: Temporal   SpO2: 100%   Weight: 146 lb (66.2 kg)   Height: '5\' 7"'$  (1.702 m)    Body mass index is 22.87 kg/m.  General:  WDWN in NAD; vital signs documented above Gait: Not observed HENT: WNL, normocephalic Pulmonary: normal non-labored breathing , without Rales, rhonchi,   wheezing Cardiac: Soft systolic murmur, regular rate and rhythm.  No carotid bruits. Abdomen: soft, NT, no masses Skin: without rashes Vascular Exam/Pulses: Radial pulses 2+ bilaterally Extremities: without ischemic changes, without Gangrene , without cellulitis; without open wounds;  Musculoskeletal: no muscle wasting or atrophy  Neurologic: A&O X 3;  No focal weakness or paresthesias are detected Psychiatric:  The pt has Normal affect.  Non-Invasive Vascular Imaging   B Carotid Duplex (06/16/2022):   Right Carotid Findings:  +----------+--------+--------+--------+------------------+-----------------  -+            PSV cm/sEDV cm/sStenosisPlaque DescriptionComments             +----------+--------+--------+--------+------------------+-----------------  -+  CCA Prox  78      25                                                     +----------+--------+--------+--------+------------------+-----------------  -+  CCA Mid   83      24                                intimal  thickening  +----------+--------+--------+--------+------------------+-----------------  -+  CCA Distal80      25                                                     +----------+--------+--------+--------+------------------+-----------------  -+  ICA Prox  76      23      Normal                                         +----------+--------+--------+--------+------------------+-----------------  -+  ICA Mid   74      22                                                     +----------+--------+--------+--------+------------------+-----------------  -+  ICA Distal67      27                                                     +----------+--------+--------+--------+------------------+-----------------  -+  ECA       80      17                                                     +----------+--------+--------+--------+------------------+-----------------  -+    +----------+--------+-------+----------------+-------------------+            PSV cm/sEDV cmsDescribe        Arm Pressure (mmHG)  +----------+--------+-------+----------------+-------------------+  [id number]             Multiphasic, WNL                     +----------+--------+-------+----------------+-------------------+   +---------+--------+--+--------+--+---------+  VertebralPSV cm/s60EDV cm/s18Antegrade  +---------+--------+--+--------+--+---------+       Left Carotid Findings:  +----------+--------+--------+--------+------------------+-----------------  ----+            PSV cm/sEDV cm/sStenosisPlaque DescriptionComments                +----------+--------+--------+--------+------------------+-----------------  ----+  CCA Prox  93      33                                                        +----------+--------+--------+--------+------------------+-----------------  ----+  CCA Mid   75      30                                intimal  thickening     +----------+--------+--------+--------+------------------+-----------------  ----+  CCA Distal69      17                                stent                    +----------+--------+--------+--------+------------------+-----------------  ----+  ICA Prox  71      27                                stent                    +----------+--------+--------+--------+------------------+-----------------  ----+  ICA Mid   48      19                                stent                    +----------+--------+--------+--------+------------------+-----------------  ----+  ICA Distal                                          unable to  visualize  past stent              +----------+--------+--------+--------+------------------+-----------------  ----+  ECA       87      16                                                         +----------+--------+--------+--------+------------------+-----------------  ----+   +----------+--------+--------+----------------+-------------------+            PSV cm/sEDV cm/sDescribe        Arm Pressure (mmHG)  +----------+--------+--------+----------------+-------------------+  [id number]             Multiphasic, WNL                     +----------+--------+--------+----------------+-------------------+   +---------+--------+--+--------+--+---------+  VertebralPSV cm/s46EDV cm/s16Antegrade  +---------+--------+--+--------+--+---------+   Medical Decision Making   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  He endorses occasional pulsatile tinnitus over the past couple weeks, without any hearing changes.  He has spoken to Dr. Trula Slade about this and reassured her that this has nothing to do with his previous surgery in December.  He can follow-up with his PCP about this issue if it continues. No evidence of stenosis in his bilateral carotid arteries.  Left internal carotid artery stent is patent. He will continue his aspirin and Crestor.  He can discontinue his Plavix.  His original Crestor prescription given to him after his surgery in December has 3 more refills until the end of this year.  He is aware to get his future prescriptions from his PCP once these run out. He will follow-up with [place] in 1 year with a bilateral carotid artery duplex.  He is aware to come back sooner if he develops any issues.   Vicente Serene PA-C [facility] Office: [facility] MD: Trula Slade

## 2022-07-11 ENCOUNTER — Other Ambulatory Visit: Payer: Self-pay

## 2022-07-11 DIAGNOSIS — I6523 Occlusion and stenosis of bilateral carotid arteries: Secondary | ICD-10-CM

## 2022-12-03 IMAGING — MR MR PROSTATE WO/W CM
12 series · 48 of 48 positions shown · IV contrast (multihance)
Comparison: None.

CLINICAL DATA: Elevated PSA.

EXAM:
MR PROSTATE WITHOUT AND WITH CONTRAST
TECHNIQUE: Multiplanar multisequence MRI images were obtained of the pelvis
centered about the prostate. Pre and post contrast images were
obtained.
CONTRAST:  12mL MULTIHANCE GADOBENATE DIMEGLUMINE 529 MG/ML IV SOLN

[Series 3: T2 · coronal · 3.0mm · 0.62mm/px · 1 of 32 slices shown (1 of 3)]
[im 1/32]
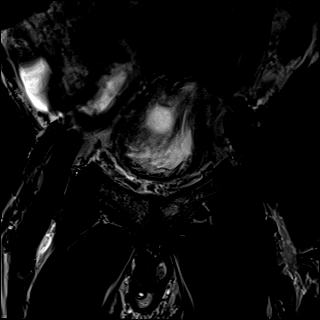

[Series 4: T1 · axial · 5.0mm · 1.25mm/px · 1 of 80 slices shown]
[im 1/80]
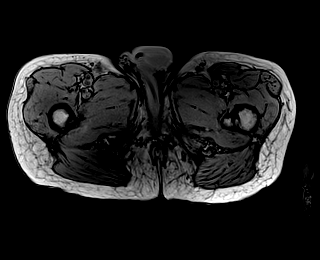

[Series 5: DWI · axial · 3.0mm · 1.75mm/px · 1 of 105 slices shown (1 of 3)]
[im 1/105]
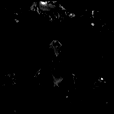

[Series 6: DWI · axial · 3.0mm · 1.75mm/px · 1 of 35 slices shown (2 of 3)]
[im 1/35]
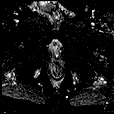

[Series 7: DWI · axial · 3.0mm · 1.75mm/px · 1 of 35 slices shown (3 of 3)]
[im 1/35]
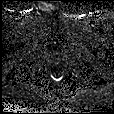

[Series 8: T2 · axial · 3.0mm · 0.62mm/px · 1 of 35 slices shown (2 of 3)]
[im 1/35]
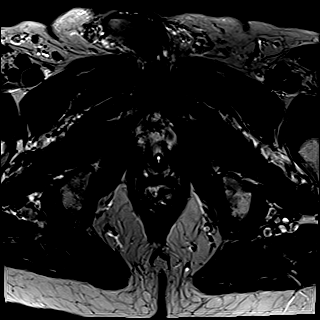

[Series 9: T2 · axial · 1.0mm · 1.04mm/px · 1 of 96 slices shown (3 of 3)]
[im 1/96]
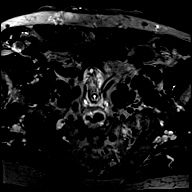

[Series 10: pre t1_twist_tra_dyn · axial · non-contrast · 3.5mm · 1.04mm/px · 1 of 30 slices shown]
[im 1/30]
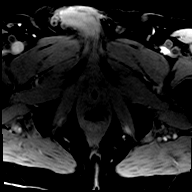

[Series 11: post t1_twist_tra_dyn-copy center · axial · non-contrast · 3.5mm · 1.04mm/px · z∈[-9,+92]mm · 18 of 900 slices shown]
[im 1/900]
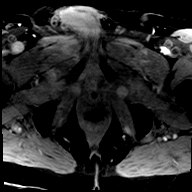
[im 53/900]
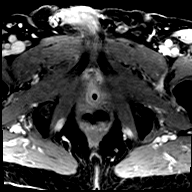
[im 106/900]
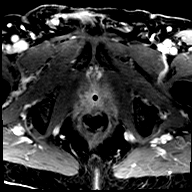
[im 159/900]
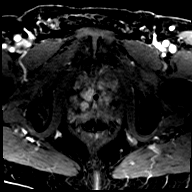
[im 212/900]
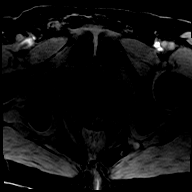
[im 265/900]
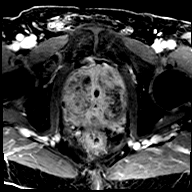
[im 318/900]
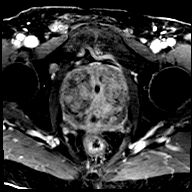
[im 371/900]
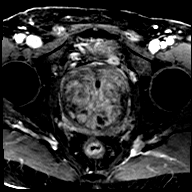
[im 424/900]
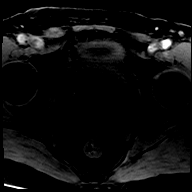
[im 476/900]
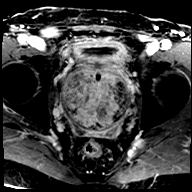
[im 529/900]
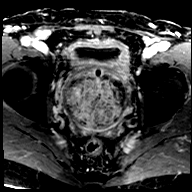
[im 582/900]
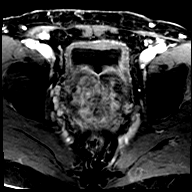
[im 635/900]
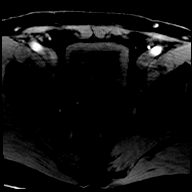
[im 688/900]
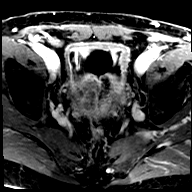
[im 741/900]
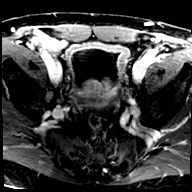
[im 794/900]
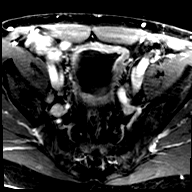
[im 847/900]
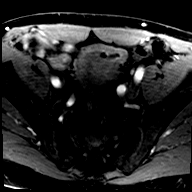
[im 900/900]
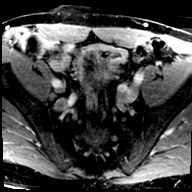

[Series 12: post t1_twist_tra_dyn-copy cent_sub · axial · 3.5mm · 1.04mm/px · z∈[-9,+92]mm · 18 of 869 slices shown]
[im 1/869]
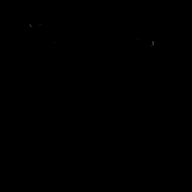
[im 52/869]
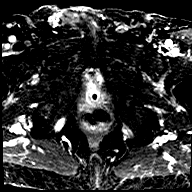
[im 103/869]
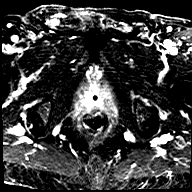
[im 154/869]
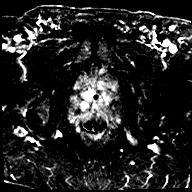
[im 205/869]
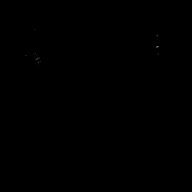
[im 256/869]
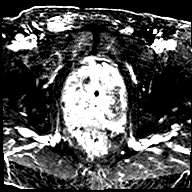
[im 307/869]
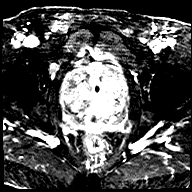
[im 358/869]
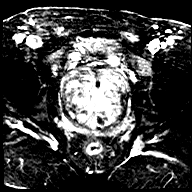
[im 409/869]
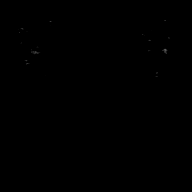
[im 460/869]
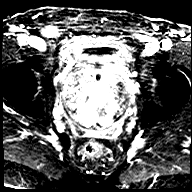
[im 511/869]
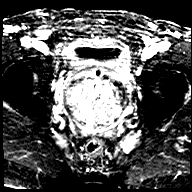
[im 562/869]
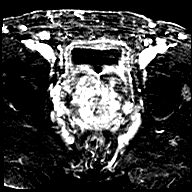
[im 613/869]
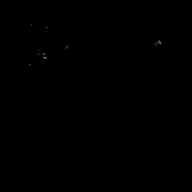
[im 664/869]
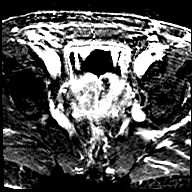
[im 715/869]
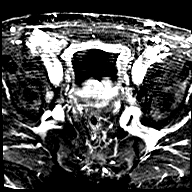
[im 766/869]
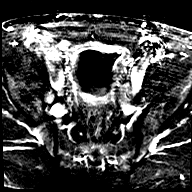
[im 817/869]
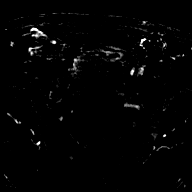
[im 869/869]
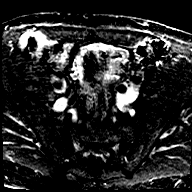

[Series 13: t1_vibe_dixon_tra_f · axial · 2.5mm · 0.91mm/px · z∈[-39,+158]mm · 2 of 80 slices shown]
[im 1/80]
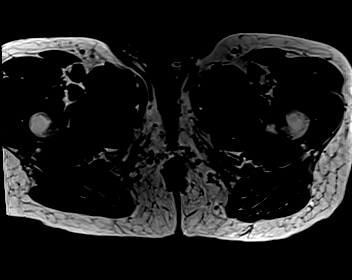
[im 80/80]
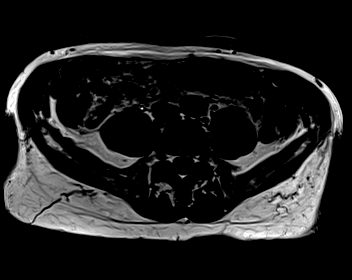

[Series 14: t1_vibe_dixon_tra_w · axial · 2.5mm · 0.91mm/px · z∈[-39,+158]mm · 2 of 80 slices shown]
[im 1/80]
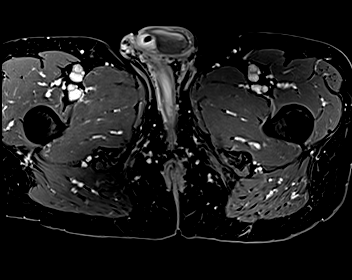
[im 80/80]
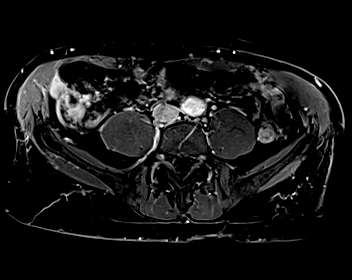

[48 of 48 positions shown; findings below may reference images not displayed]

FINDINGS: Prostate:

-- Peripheral Zone: Diffuse thinning due to marked central gland
enlargement, which limits evaluation. No focal lesion seen on ADC or
high b-value DWI sequences.

-- Transition/Central Zone: Markedly enlarged with diffuse
involvement by encapsulated BPH nodules noted; however, no
suspicious nodules are identified on T2-weighted or diffusion
sequences.

-- Measurements/Volume:  8.2 by 7.4 x 7.3 cm (volume = 230 cm^3)

Transcapsular spread:  Absent

Seminal vesicle involvement:  Absent

Neurovascular bundle involvement:  Absent

Pelvic adenopathy: No pathologically enlarged lymph nodes
identified. Sub-cm right external iliac and pelvic sidewall lymph
nodes noted.

Bone metastasis: None visualized

Other: Foley catheter seen within the urinary bladder, which is
nondilated. Diffuse bladder wall thickening, consistent chronic
bladder outlet obstruction.
IMPRESSION: No radiographic evidence of high-grade prostate carcinoma. PI-RADS 1
(v2.1): Very Low (clinically significant cancer highly unlikely)

## 2023-03-19 NOTE — Progress Notes (Signed)
Fax received from [facility] on 03/05/23 for medical clearance/medication hold for dental procedures to be signed by V.W. Myra Gianotti, MD.  Provider signed on 03/16/23, form faxed back to sender on 03/17/23, verified successful, sent to scan center.

## 2023-03-26 ENCOUNTER — Ambulatory Visit (INDEPENDENT_AMBULATORY_CARE_PROVIDER_SITE_OTHER): Payer: Medicare Other | Admitting: Gastroenterology

## 2023-03-26 ENCOUNTER — Encounter: Payer: Self-pay | Admitting: Gastroenterology

## 2023-03-26 VITALS — BP 130/88 | HR 73 | Ht 67.0 in | Wt 140.0 lb

## 2023-03-26 DIAGNOSIS — D509 Iron deficiency anemia, unspecified: Secondary | ICD-10-CM

## 2023-03-26 NOTE — Patient Instructions (Signed)
You have been scheduled for an endoscopy. Please follow written instructions given to you at your visit today. If you use inhalers (even only as needed), please bring them with you on the day of your procedure.  The [facility] providers would like to encourage you to use MYCHART to communicate with providers for non-urgent requests or questions.  Due to long hold times on the telephone, sending your provider a message by MYCHART may be a faster and more efficient way to get a response.  Please allow 48 business hours for a response.  Please remember that this is for non-urgent requests.   Due to recent changes in healthcare laws, you may see the results of your imaging and laboratory studies on MyChart before your provider has had a chance to review them.  We understand that in some cases there may be results that are confusing or concerning to you. Not all laboratory results come back in the same time frame and the provider may be waiting for multiple results in order to interpret others.  Please give us 48 hours in order for your provider to thoroughly review all the results before contacting the office for clarification of your results.   Thank you for choosing me and [facility].  Malcolm T. Stark, Jr., MD., FACG  

## 2023-03-26 NOTE — Progress Notes (Signed)
    Assessment     Iron deficiency anemia - R/O UGI tract source of blood loss, poor iron absorption  Personal history of adenomatous colon polyps - no longer in surveillance   Recommendations    Schedule EGD. The risks (including bleeding, perforation, infection, missed lesions, medication reactions and possible hospitalization or surgery if complications occur), benefits, and alternatives to endoscopy with possible biopsy and possible dilation were discussed with the patient and they consent to proceed.     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 Christella Hartigan, referred by Dr. Leonie Douglas for iron deficiency anemia.  A mild microcytic anemia was noted in 2022 and 2023. In Aug 2023 iron saturation 14.5% and ferritin 11.3.  Treated with iron and Hgb is now 14.1 with a normal MCV.  He has no GI complaints.  He states he does not eat red meat.  He underwent colonoscopy in October 2022 as below.  He is currently maintained on iron Monday, Wednesday and Friday.  Denies weight loss, abdominal pain, constipation, diarrhea, change in stool caliber, melena, hematochezia, nausea, vomiting, dysphagia, reflux symptoms, chest pain.   Colonoscopy Oct 2022 - Internal hemorrhoids.  - The examination was otherwise normal on direct and retroflexion views.  Labs / Imaging       Latest Ref Rng & Units 10/17/2021    1:20 PM  Hepatic Function  Total Protein 6.5 - 8.1 g/dL 7.4   Albumin 3.5 - 5.0 g/dL 3.9   AST 15 - 41 U/L 30   ALT 0 - 44 U/L 27   Alk Phosphatase 38 - 126 U/L 65   Total Bilirubin 0.3 - 1.2 mg/dL 0.6        Latest Ref Rng & Units 10/24/2021    3:47 AM 10/23/2021    2:00 PM 10/17/2021    1:20 PM  CBC  WBC 4.0 - 10.5 K/uL 8.1  7.7  5.7   Hemoglobin 13.0 - 17.0 g/dL 40.9  81.1  91.4   Hematocrit 39.0 - 52.0 % 35.6  37.5  43.3   Platelets 150 - 400 K/uL 146  157  176    Current Medications, Allergies, Past Medical History, Past Surgical History, Family  History and Social History were reviewed in [employer] record.   Physical Exam: General: Well developed, well nourished, no acute distress Head: Normocephalic and atraumatic Eyes: Sclerae anicteric, EOMI Ears: Normal auditory acuity Mouth: No deformities or lesions noted Lungs: Clear throughout to auscultation Heart: Regular rate and rhythm; No murmurs, rubs or bruits Abdomen: Soft, non tender and non distended. No masses, hepatosplenomegaly or hernias noted. Normal Bowel sounds Rectal: Not done Musculoskeletal: Symmetrical with no gross deformities  Pulses:  Normal pulses noted Extremities: No edema or deformities noted Neurological: Alert oriented x 4, grossly nonfocal Psychological:  Alert and cooperative. Normal mood and affect   Alyzah Pelly T. Russella Dar, MD 03/26/2023, 10:26 AM

## 2023-04-06 ENCOUNTER — Encounter: Payer: Self-pay | Admitting: Gastroenterology

## 2023-04-06 ENCOUNTER — Ambulatory Visit (AMBULATORY_SURGERY_CENTER): Payer: Medicare Other | Admitting: Gastroenterology

## 2023-04-06 VITALS — BP 108/74 | HR 77 | Temp 97.5°F | Resp 16 | Ht 67.0 in | Wt 140.0 lb

## 2023-04-06 DIAGNOSIS — D509 Iron deficiency anemia, unspecified: Secondary | ICD-10-CM

## 2023-04-06 DIAGNOSIS — K222 Esophageal obstruction: Secondary | ICD-10-CM | POA: Diagnosis not present

## 2023-04-06 DIAGNOSIS — K2980 Duodenitis without bleeding: Secondary | ICD-10-CM | POA: Diagnosis not present

## 2023-04-06 DIAGNOSIS — K253 Acute gastric ulcer without hemorrhage or perforation: Secondary | ICD-10-CM

## 2023-04-06 DIAGNOSIS — K319 Disease of stomach and duodenum, unspecified: Secondary | ICD-10-CM | POA: Diagnosis not present

## 2023-04-06 DIAGNOSIS — K209 Esophagitis, unspecified without bleeding: Secondary | ICD-10-CM | POA: Diagnosis not present

## 2023-04-06 MED ORDER — SODIUM CHLORIDE 0.9 % IV SOLN
500.0000 mL | INTRAVENOUS | Status: DC
Start: 2023-04-06 — End: 2023-04-06

## 2023-04-06 MED ORDER — OMEPRAZOLE 20 MG PO CPDR
20.0000 mg | DELAYED_RELEASE_CAPSULE | Freq: Every day | ORAL | 12 refills | Status: AC
Start: 2023-04-06 — End: ?

## 2023-04-06 NOTE — Progress Notes (Signed)
Called to room to assist during endoscopic procedure.  Patient ID and intended procedure confirmed with present staff. Received instructions for my participation in the procedure from the performing physician.  

## 2023-04-06 NOTE — Progress Notes (Signed)
Patient reports no changes to health or medications since office visit.  

## 2023-04-06 NOTE — Patient Instructions (Addendum)
-  Follow antireflux measures. Handout provided  - Continue present medications. - Omeprazole 20 mg po qd, 1 year of refills. - Await pathology results. - Avoid or at least minimize NSAIDs. - Repeat upper endoscopy in 3 months - follow up gastric ulcer  YOU HAD AN ENDOSCOPIC PROCEDURE TODAY AT THE [facility]:   Refer to the procedure report that was given to you for any specific questions about what was found during the examination.  If the procedure report does not answer your questions, please call your gastroenterologist to clarify.  If you requested that your care partner not be given the details of your procedure findings, then the procedure report has been included in a sealed envelope for you to review at your convenience later.  YOU SHOULD EXPECT: Some feelings of bloating in the abdomen. Passage of more gas than usual.  Walking can help get rid of the air that was put into your GI tract during the procedure and reduce the bloating. If you had a lower endoscopy (such as a colonoscopy or flexible sigmoidoscopy) you may notice spotting of blood in your stool or on the toilet paper. If you underwent a bowel prep for your procedure, you may not have a normal bowel movement for a few days.  Please Note:  You might notice some irritation and congestion in your nose or some drainage.  This is from the oxygen used during your procedure.  There is no need for concern and it should clear up in a day or so.  SYMPTOMS TO REPORT IMMEDIATELY:   Following upper endoscopy (EGD)  Vomiting of blood or coffee ground material  New chest pain or pain under the shoulder blades  Painful or persistently difficult swallowing  New shortness of breath  Fever of 100F or higher  Black, tarry-looking stools  For urgent or emergent issues, a gastroenterologist can be reached at any hour by calling [phone]. Do not use MyChart messaging for urgent concerns.    DIET:  We do recommend a small  meal at first, but then you may proceed to your regular diet.  Drink plenty of fluids but you should avoid alcoholic beverages for 24 hours.  ACTIVITY:  You should plan to take it easy for the rest of today and you should NOT DRIVE or use heavy machinery until tomorrow (because of the sedation medicines used during the test).    FOLLOW UP: Our staff will call the number listed on your records the next business day following your procedure.  We will call around 7:15- 8:00 am to check on you and address any questions or concerns that you may have regarding the information given to you following your procedure. If we do not reach you, we will leave a message.     If any biopsies were taken you will be contacted by phone or by letter within the next 1-3 weeks.  Please call us at [phone] if you have not heard about the biopsies in 3 weeks.    SIGNATURES/CONFIDENTIALITY: You and/or your care partner have signed paperwork which will be entered into your electronic medical record.  These signatures attest to the fact that that the information above on your After Visit Summary has been reviewed and is understood.  Full responsibility of the confidentiality of this discharge information lies with you and/or your care-partner.

## 2023-04-06 NOTE — Progress Notes (Signed)
Uneventful anesthetic. Report to pacu rn. Vss. Care resumed by rn. 

## 2023-04-06 NOTE — Op Note (Signed)
[facility] Patient Name: Adam Warwick Md Procedure Date: 04/06/2023 1:20 PM MRN: [id number] Endoscopist: Meryl Dare , MD, [phone] Age: 82 Referring MD:  Date of Birth: [birth year] Gender: Male Account #: [id number] Procedure:                Upper GI endoscopy Indications:              Unexplained iron deficiency anemia Medicines:                Monitored Anesthesia Care Procedure:                Pre-Anesthesia Assessment:                           - Prior to the procedure, a History and Physical                            was performed, and patient medications and                            allergies were reviewed. The patient's tolerance of                            previous anesthesia was also reviewed. The risks                            and benefits of the procedure and the sedation                            options and risks were discussed with the patient.                            All questions were answered, and informed consent                            was obtained. Prior Anticoagulants: The patient has                            taken no anticoagulant or antiplatelet agents. ASA                            Grade Assessment: II - A patient with mild systemic                            disease. After reviewing the risks and benefits,                            the patient was deemed in satisfactory condition to                            undergo the procedure.                           After obtaining informed consent, the endoscope was  passed under direct vision. Throughout the                            procedure, the patient's blood pressure, pulse, and                            oxygen saturations were monitored continuously. The                            Olympus Scope [id number] was introduced through the                            mouth, and advanced to the second part of duodenum.                            The upper GI  endoscopy was accomplished without                            difficulty. The patient tolerated the procedure                            well. Scope In: Scope Out: Findings:                 Two 5 mm non-bleeding erosions were found in the                            middle third of the esophagus. Biopsies were taken                            with a cold forceps for histology.                           One benign-appearing, intrinsic mild,                            non-obstructing stenosis was found at the                            gastroesophageal junction. This stenosis measured                            1.5 cm (inner diameter) x less than one cm (in                            length). The stenosis was traversed.                           The exam of the esophagus was otherwise normal.                           A medium-sized hiatal hernia was present.                           One non-bleeding superficial gastric ulcer with a  flat pigmented spot (Forrest Class IIc) was found                            on the greater curvature of the stomach. The lesion                            was 7 mm in largest dimension.                           Multiple localized small erosions with no bleeding                            and no stigmata of recent bleeding were found in                            the gastric antrum. Biopsies were taken with a cold                            forceps for histology.                           The exam of the stomach was otherwise normal.                           Patchy mildly erythematous mucosa without active                            bleeding and with no stigmata of bleeding was found                            in the duodenal bulb.                           The exam of the duodenum was otherwise normal.                            Biopsied. Complications:            No immediate complications. Estimated Blood Loss:     Estimated  blood loss was minimal. Impression:               - Two non-bleeding erosions in the middle third of                            the esophagus. Biopsied.                           - Benign-appearing esophageal stenosis.                           - Medium-sized hiatal hernia.                           - Non-bleeding gastric ulcer with a flat pigmented  spot (Forrest Class IIc).                           - Erosive gastropathy with no bleeding and no                            stigmata of recent bleeding. Biopsied.                           - Erythematous duodenopathy. Biopsied. Recommendation:           - Patient has a contact number available for                            emergencies. The signs and symptoms of potential                            delayed complications were discussed with the                            patient. Return to normal activities tomorrow.                            Written discharge instructions were provided to the                            patient.                           - Resume previous diet.                           - Follow antireflux measures.                           - Continue present medications.                           - Omeprazole 20 mg po qd, 1 year of refills.                           - Await pathology results.                           - Avoid or at least minimize NSAIDs.                           - Repeat upper endoscopy in 3 months - follow up                            gastric ulcer Meryl Dare, MD 04/06/2023 1:51:19 PM This report has been signed electronically.

## 2023-04-06 NOTE — Progress Notes (Signed)
See 03/26/2023 H&P, no changes

## 2023-04-07 ENCOUNTER — Telehealth: Payer: Self-pay | Admitting: *Deleted

## 2023-04-07 NOTE — Telephone Encounter (Signed)
  Follow up Call-    Row Labels 04/06/2023   12:56 PM 08/07/2021   12:59 PM  Call back number   Section Header. No data exists in this row.    Post procedure Call Back phone  #   [phone]  Permission to leave phone message   Yes Yes     Patient questions:  Do you have a fever, pain , or abdominal swelling? No. Pain Score  0 *  Have you tolerated food without any problems? Yes.    Have you been able to return to your normal activities? Yes.    Do you have any questions about your discharge instructions: Diet   No. Medications  No. Follow up visit  No.  Do you have questions or concerns about your Care? No.  Actions: * If pain score is 4 or above: No action needed, pain <4.

## 2023-04-09 ENCOUNTER — Encounter: Payer: Self-pay | Admitting: Gastroenterology

## 2023-04-22 NOTE — Progress Notes (Unsigned)
Cardiology Office Note   Date:  04/23/2023   ID:  Adam Seashore MD, DOB March 31, 1941, MRN [id number]  PCP:  Adam Gambler, MD    No chief complaint on file.  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 male former patient of Adam [facility] who had a left TCAR in December 2022.  Also has a history of MGUS and right bundle branch block.  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 He was subsequently seen by [facility], underwent left carotid TCAR.  Father died of a stroke in his 50s. [name] had the same fate. Both smoked cigarettes. Adam Flores has never smoked. There is no history of hypertension, diabetes, or other problems.   Denies : Chest pain. Dizziness. Leg edema. Nitroglycerin use. Orthopnea. Palpitations. Paroxysmal nocturnal dyspnea. Shortness of breath. Syncope.    He run 4 miles once a week.  He does some weight lifting other days.      Past Medical History:  Diagnosis Date   Asthma    BPH (benign prostatic hyperplasia)    Carotid stenosis    Colon polyps    IgA monoclonal gammopathy of uncertain significance    Prurigo nodularis    Right bundle branch block     Past Surgical History:  Procedure Laterality Date   TONSILLECTOMY     TRANSCAROTID ARTERY REVASCULARIZATION  Left 10/23/2021   Procedure: LEFT TRANSCAROTID ARTERY REVASCULARIZATION;  Surgeon: Adam Libman, MD;  Location: [facility] OR;  Service: Vascular;  Laterality: Left;     Current Outpatient Medications  Medication Sig Dispense Refill   albuterol (VENTOLIN HFA) 108 (90 Base) MCG/ACT inhaler Inhale 1-2 puffs into the lungs every 6 (six) hours as needed for shortness of  breath or wheezing.     alfuzosin (UROXATRAL) 10 MG 24 hr tablet Take 1 tablet by mouth daily.     amoxicillin (AMOXIL) 500 MG capsule SMARTSIG:4 Capsule(s) By Mouth     aspirin 81 MG EC tablet Take 81 mg by mouth daily.     Cholecalciferol (VITAMIN D3 PO) Take 1 tablet by mouth daily.     clotrimazole (LOTRIMIN) 1 % external solution APPLY SMALL AMOUNT TO AFFECTED AREA TWICE A DAY APPLY TO FUNGAL TOENAILS     FEROSUL 325 (65 Fe) MG tablet Take by mouth.     finasteride (PROSCAR) 5 MG tablet Take 5 mg by mouth daily.     folic acid (FOLVITE) 1 MG tablet Take 1 mg by mouth daily.     methotrexate (RHEUMATREX) 2.5 MG tablet Take 2.5 mg by mouth once a week.     Multiple Vitamins-Minerals (CENTRUM SILVER 50+MEN PO) Take 1 tablet by mouth daily.     Omega-3 1000 MG CAPS Take 1,000 mg by mouth daily.     omeprazole (PRILOSEC) 20 MG capsule Take 1 capsule (20 mg total) by mouth daily. 30 capsule 12   POTASSIUM PO Take 1 tablet by mouth daily.     REFRESH 1.4-0.6 % SOLN Apply to eye.     REFRESH PLUS 0.5 % SOLN Apply to eye.     rosuvastatin (CRESTOR) 10 MG tablet Take  1 tablet (10 mg total) by mouth daily. 30 tablet 12   Saw Palmetto, Serenoa repens, (SAW PALMETTO PO) Take 1 capsule by mouth daily.     sodium chloride (OCEAN) 0.65 % nasal spray Place into the nose.     tadalafil (CIALIS) 20 MG tablet Take 1 tablet by mouth as needed.     tamsulosin (FLOMAX) 0.4 MG CAPS capsule Take 0.4 mg by mouth daily.     triamcinolone ointment (KENALOG) 0.1 % Apply 1 application topically daily as needed (itching).     fluticasone-salmeterol (ADVAIR) 100-50 MCG/ACT AEPB Inhale 1 puff into the lungs at bedtime.     No current facility-administered medications for this visit.    Allergies:   Patient has no known allergies.    Social History:  The patient  reports that he has never smoked. He has never used smokeless tobacco. He reports current alcohol use. He reports that he does not use drugs.   Family  History:  The patient's family history includes Congestive Heart Failure in his mother; Hypertension in his sister; Prostate cancer in his brother; Stroke in his father.    ROS:  Please see the history of present illness.   Otherwise, review of systems are positive for mild joint pains.   All other systems are reviewed and negative.    PHYSICAL EXAM: VS:  BP 126/80   Pulse 70   Ht 5\' 7"  (1.702 m)   Wt 140 lb (63.5 kg)   SpO2 98%   BMI 21.93 kg/m  , BMI Body mass index is 21.93 kg/m. GEN: Well nourished, well developed, in no acute distress HEENT: normal Neck: no JVD, carotid bruits, or masses Cardiac: RRR; no murmurs, rubs, or gallops,no edema  Respiratory:  clear to auscultation bilaterally, normal work of breathing GI: soft, nontender, nondistended, + BS MS: no deformity or atrophy Skin: warm and dry, no rash Neuro:  Strength and sensation are intact Psych: euthymic mood, full affect   EKG:   The ekg ordered today demonstrates NSR, RBBB   Recent Labs: No results found for requested labs within last 365 days.   Lipid Panel    Component Value Date/Time   CHOL 110 10/24/2021 0350   TRIG 22 10/24/2021 0350   HDL 72 10/24/2021 0350   CHOLHDL 1.5 10/24/2021 0350   VLDL 4 10/24/2021 0350   LDLCALC 34 10/24/2021 0350     Other studies Reviewed: Additional studies/ records that were reviewed today with results demonstrating: labs reviewed.   ASSESSMENT AND PLAN:  Carotid artery disease: Status post TCAR.  No cardiac sx.  On statin therapy: LDL 43 on statin therapy. Right bundle branch block: Chronic. Staying active.  Try to stay active with avoiding falling.    Current medicines are reviewed at length with the patient today.  The patient concerns regarding his medicines were addressed.  The following changes have been made:  No change  Labs/ tests ordered today include:   Orders Placed This Encounter  Procedures   EKG 12-Lead    Recommend 150 minutes/week  of aerobic exercise Low fat, low carb, high fiber diet recommended  Disposition:   FU in 1 yeaer   Signed, Adam Muss, MD  04/23/2023 10:41 AM    [facility] [address] [place], [place], [place]  [address] Phone: [phone]; Fax: [phone]

## 2023-04-23 ENCOUNTER — Encounter: Payer: Self-pay | Admitting: Interventional Cardiology

## 2023-04-23 ENCOUNTER — Ambulatory Visit: Payer: Medicare Other | Attending: Interventional Cardiology | Admitting: Interventional Cardiology

## 2023-04-23 VITALS — BP 126/80 | HR 70 | Ht 67.0 in | Wt 140.0 lb

## 2023-04-23 DIAGNOSIS — I4510 Unspecified right bundle-branch block: Secondary | ICD-10-CM | POA: Insufficient documentation

## 2023-04-23 DIAGNOSIS — Z79899 Other long term (current) drug therapy: Secondary | ICD-10-CM | POA: Insufficient documentation

## 2023-04-23 DIAGNOSIS — I6522 Occlusion and stenosis of left carotid artery: Secondary | ICD-10-CM | POA: Diagnosis not present

## 2023-04-23 NOTE — Patient Instructions (Signed)
Medication Instructions:  Your physician recommends that you continue on your current medications as directed. Please refer to the Current Medication list given to you today.  *If you need a refill on your cardiac medications before your next appointment, please call your pharmacy*   Lab Work: none If you have labs (blood work) drawn today and your tests are completely normal, you will receive your results only by: MyChart Message (if you have MyChart) OR A paper copy in the mail If you have any lab test that is abnormal or we need to change your treatment, we will call you to review the results.   Testing/Procedures: none   Follow-Up: At [facility], you and your health needs are our priority.  As part of our continuing mission to provide you with exceptional heart care, we have created designated Provider Care Teams.  These Care Teams include your primary Cardiologist (physician) and Advanced Practice Providers (APPs -  Physician Assistants and Nurse Practitioners) who all work together to provide you with the care you need, when you need it.  We recommend signing up for the patient portal called "MyChart".  Sign up information is provided on this After Visit Summary.  MyChart is used to connect with patients for Virtual Visits (Telemedicine).  Patients are able to view lab/test results, encounter notes, upcoming appointments, etc.  Non-urgent messages can be sent to your provider as well.   To learn more about what you can do with MyChart, go to [email or web address].    Your next appointment:   12 month(s)  Provider:   Jayadeep Varanasi, MD     Other Instructions    

## 2023-06-10 ENCOUNTER — Ambulatory Visit (AMBULATORY_SURGERY_CENTER): Payer: Medicare Other

## 2023-06-10 VITALS — Ht 67.0 in | Wt 140.0 lb

## 2023-06-10 DIAGNOSIS — K253 Acute gastric ulcer without hemorrhage or perforation: Secondary | ICD-10-CM

## 2023-06-10 NOTE — Progress Notes (Signed)

## 2023-06-12 ENCOUNTER — Ambulatory Visit: Payer: Medicare Other | Admitting: Interventional Cardiology

## 2023-07-01 ENCOUNTER — Encounter: Payer: Self-pay | Admitting: Gastroenterology

## 2023-07-01 ENCOUNTER — Ambulatory Visit (AMBULATORY_SURGERY_CENTER): Payer: Medicare Other | Admitting: Gastroenterology

## 2023-07-01 VITALS — BP 113/79 | HR 75 | Temp 98.9°F | Resp 14 | Ht 67.0 in | Wt 140.0 lb

## 2023-07-01 DIAGNOSIS — K222 Esophageal obstruction: Secondary | ICD-10-CM | POA: Diagnosis not present

## 2023-07-01 DIAGNOSIS — K2970 Gastritis, unspecified, without bleeding: Secondary | ICD-10-CM

## 2023-07-01 DIAGNOSIS — K253 Acute gastric ulcer without hemorrhage or perforation: Secondary | ICD-10-CM | POA: Diagnosis not present

## 2023-07-01 DIAGNOSIS — K259 Gastric ulcer, unspecified as acute or chronic, without hemorrhage or perforation: Secondary | ICD-10-CM

## 2023-07-01 MED ORDER — SODIUM CHLORIDE 0.9 % IV SOLN: 500.0000 mL | INTRAVENOUS | Status: DC

## 2023-07-01 NOTE — Patient Instructions (Addendum)

## 2023-07-01 NOTE — Progress Notes (Signed)
Vss nad trans to pacu 

## 2023-07-01 NOTE — Progress Notes (Signed)
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      Past Medical History:  Diagnosis Date   Allergy    Anemia    Asthma    BPH (benign prostatic hyperplasia)    Carotid stenosis    Colon polyps    GERD (gastroesophageal reflux disease)    IgA monoclonal gammopathy of uncertain significance    Prurigo nodularis    Right bundle branch block     Past Surgical History:  Procedure Laterality Date   TONSILLECTOMY     TRANSCAROTID ARTERY REVASCULARIZATION  Left 10/23/2021   Procedure: LEFT TRANSCAROTID ARTERY REVASCULARIZATION;  Surgeon: Nada Libman, MD;  Location: [facility] OR;  Service: Vascular;  Laterality: Left;    Prior to Admission medications   Medication Sig Start Date End Date Taking? Authorizing Provider  aspirin 81 MG EC tablet Take 81 mg by mouth daily.   Yes [provider]  Calcium Carb-Cholecalciferol (CALCIUM 500 + D PO) Take by mouth. 09/18/15  Yes [provider]  Cholecalciferol (VITAMIN D3 PO) Take 1 tablet by mouth daily.   Yes [provider]  Docusate Sodium (DSS) 100 MG CAPS Take by mouth. 01/21/23  Yes [provider]  FEROSUL 325 (65 Fe) MG tablet Take by mouth. 10/30/22  Yes [provider]  folic acid (FOLVITE) 1 MG tablet Take 1 mg by mouth daily. 02/23/16  Yes [provider]  Multiple Vitamins-Minerals (CENTRUM SILVER 50+MEN PO) Take 1 tablet by mouth daily.   Yes [provider]  Omega-3 1000 MG CAPS Take 1,000 mg by mouth daily. 01/04/08  Yes [provider]  omeprazole (PRILOSEC) 20 MG capsule Take 1 capsule (20 mg total) by mouth daily. 04/06/23  Yes Meryl Dare, MD  POTASSIUM PO Take 1 tablet by mouth daily.   Yes [provider]  REFRESH 1.4-0.6 % SOLN Apply to eye. 11/13/22  Yes [provider]  REFRESH PLUS 0.5 % SOLN Apply to eye. 03/13/23  Yes [provider]  rosuvastatin (CRESTOR) 10 MG tablet Take 1 tablet (10 mg total) by mouth daily. 10/07/21  Yes Nada Libman, MD  Saw Palmetto, Serenoa repens, (SAW PALMETTO PO) Take 1 capsule by mouth daily. 02/22/20  Yes [provider]  albuterol (VENTOLIN HFA) 108 (90 Base) MCG/ACT inhaler Inhale 1-2 puffs into the lungs every 6 (six) hours as needed for shortness of breath or wheezing. 02/24/13   [provider]  alfuzosin (UROXATRAL) 10 MG 24 hr tablet Take 1 tablet by mouth daily. Patient not taking: Reported on 07/01/2023 01/16/22   [provider]  amoxicillin (AMOXIL) 500 MG capsule SMARTSIG:4 Capsule(s) By Mouth 03/22/23   [provider]  clobetasol cream (TEMOVATE) 0.05 % Apply topically. 11/24/22   [provider]  clotrimazole (LOTRIMIN) 1 % external solution APPLY SMALL AMOUNT TO AFFECTED AREA TWICE A DAY APPLY TO FUNGAL TOENAILS 03/12/23 03/12/24  [provider]  doxycycline (VIBRA-TABS) 100 MG tablet Take by mouth. 11/28/22   [provider]  fexofenadine (ALLEGRA) 180 MG tablet Take by mouth. 01/28/23   [provider]  finasteride (PROSCAR) 5 MG tablet Take 5 mg by mouth daily. 08/21/21   [provider]  fluticasone-salmeterol (ADVAIR) 100-50 MCG/ACT  AEPB Inhale 1 puff into the lungs at bedtime. 02/19/21 02/20/22  [provider]  methotrexate (RHEUMATREX) 2.5 MG tablet Take 2.5 mg by mouth once a week. 01/04/08   [provider]  sodium chloride (OCEAN) 0.65 % nasal spray Place into the nose. Patient not taking: Reported on 07/01/2023 01/21/23   [provider]  tadalafil (CIALIS) 20 MG tablet Take 1 tablet by mouth as needed. Patient not taking: Reported on 07/01/2023 08/22/21   [provider]  tamsulosin (FLOMAX) 0.4 MG CAPS  capsule Take 0.4 mg by mouth daily. Patient not taking: Reported on 07/01/2023    [provider]  triamcinolone ointment (KENALOG) 0.1 % Apply 1 application topically daily as needed (itching). Patient not taking: Reported on 07/01/2023 03/04/17   [provider]    Current Outpatient Medications  Medication Sig Dispense Refill   aspirin 81 MG EC tablet Take 81 mg by mouth daily.     Calcium Carb-Cholecalciferol (CALCIUM 500 + D PO) Take by mouth.     Cholecalciferol (VITAMIN D3 PO) Take 1 tablet by mouth daily.     Docusate Sodium (DSS) 100 MG CAPS Take by mouth.     FEROSUL 325 (65 Fe) MG tablet Take by mouth.     folic acid (FOLVITE) 1 MG tablet Take 1 mg by mouth daily.     Multiple Vitamins-Minerals (CENTRUM SILVER 50+MEN PO) Take 1 tablet by mouth daily.     Omega-3 1000 MG CAPS Take 1,000 mg by mouth daily.     omeprazole (PRILOSEC) 20 MG capsule Take 1 capsule (20 mg total) by mouth daily. 30 capsule 12   POTASSIUM PO Take 1 tablet by mouth daily.     REFRESH 1.4-0.6 % SOLN Apply to eye.     REFRESH PLUS 0.5 % SOLN Apply to eye.     rosuvastatin (CRESTOR) 10 MG tablet Take 1 tablet (10 mg total) by mouth daily. 30 tablet 12   Saw Palmetto, Serenoa repens, (SAW PALMETTO PO) Take 1 capsule by mouth daily.     albuterol (VENTOLIN HFA) 108 (90 Base) MCG/ACT inhaler Inhale 1-2 puffs into the lungs every 6 (six) hours as needed for shortness of breath or wheezing.     alfuzosin (UROXATRAL) 10 MG 24 hr tablet Take 1 tablet by mouth daily. (Patient not taking: Reported on 07/01/2023)     amoxicillin (AMOXIL) 500 MG capsule SMARTSIG:4 Capsule(s) By Mouth     clobetasol cream (TEMOVATE) 0.05 % Apply topically.     clotrimazole (LOTRIMIN) 1 % external solution APPLY SMALL AMOUNT TO AFFECTED AREA TWICE A DAY APPLY TO FUNGAL TOENAILS     doxycycline (VIBRA-TABS) 100 MG tablet Take by mouth.     fexofenadine (ALLEGRA) 180 MG tablet Take by mouth.     finasteride (PROSCAR) 5 MG  tablet Take 5 mg by mouth daily.     fluticasone-salmeterol (ADVAIR) 100-50 MCG/ACT AEPB Inhale 1 puff into the lungs at bedtime.     methotrexate (RHEUMATREX) 2.5 MG tablet Take 2.5 mg by mouth once a week.     sodium chloride (OCEAN) 0.65 % nasal spray Place into the nose. (Patient not taking: Reported on 07/01/2023)     tadalafil (CIALIS) 20 MG tablet Take 1 tablet by mouth as needed. (Patient not taking: Reported on 07/01/2023)     tamsulosin (FLOMAX) 0.4 MG CAPS capsule Take 0.4 mg by mouth daily. (Patient not taking: Reported on 07/01/2023)     triamcinolone ointment (KENALOG) 0.1 % Apply 1  application topically daily as needed (itching). (Patient not taking: Reported on 07/01/2023)     Current Facility-Administered Medications  Medication Dose Route Frequency Provider Last Rate Last Admin   0.9 %  sodium chloride infusion  500 mL Intravenous Continuous Meryl Dare, MD        Allergies as of 07/01/2023   (No Known Allergies)    Family History  Problem Relation Age of Onset   Congestive Heart Failure Mother    Stroke Father    Hypertension Sister    Prostate cancer Brother    Stomach cancer Neg Hx    Esophageal cancer Neg Hx    Rectal cancer Neg Hx    Colon cancer Neg Hx    Colon polyps Neg Hx     Social History   Socioeconomic History   Marital status: Married    Spouse name: Not on file   Number of children: 0   Years of education: Not on file   Highest education level: Not on file  Occupational History   Occupation: radiologist    Comment: retired  Tobacco Use   Smoking status: Never   Smokeless tobacco: Never  Vaping Use   Vaping status: Never Used  Substance and Sexual Activity   Alcohol use: Yes    Alcohol/week: 0.0 standard drinks of alcohol    Comment: social   Drug use: No   Sexual activity: Not on file  Other Topics Concern   Not on file  Social History Narrative   Not on file   Social Determinants of Health   Financial Resource Strain: Not on  file  Food Insecurity: Not on file  Transportation Needs: Not on file  Physical Activity: Not on file  Stress: Not on file  Social Connections: Not on file  Intimate Partner Violence: Not on file    Review of Systems:  All systems reviewed were negative except where noted in HPI.   Physical Exam:  General:  Alert, well-developed, in NAD Head:  Normocephalic and atraumatic. Eyes:  Sclera clear, no icterus.   Conjunctiva pink. Ears:  Normal auditory acuity. Mouth:  No deformity or lesions.  Neck:  Supple; no masses. Lungs:  Clear throughout to auscultation.   No wheezes, crackles, or rhonchi.  Heart:  Regular rate and rhythm; no murmurs. Abdomen:  Soft, nondistended, nontender. No masses, hepatomegaly. No palpable masses.  Normal bowel sounds.    Rectal:  Deferred   Msk:  Symmetrical without gross deformities. Extremities:  Without edema. Neurologic:  Alert and  oriented x 4; grossly normal neurologically. Skin:  Intact without significant lesions or rashes. Psych:  Alert and cooperative. Normal mood and affect.   Venita Lick. Russella Dar  07/01/2023, 1:54 PM See Loretha Stapler, [facility], to contact our on call provider

## 2023-07-01 NOTE — Progress Notes (Signed)
Pt's states no medical or surgical changes since previsit or office visit. 

## 2023-07-01 NOTE — Op Note (Signed)
[facility] Patient Name: Adam Keirns Md Procedure Date: 07/01/2023 1:47 PM MRN: [id number] Endoscopist: Meryl Dare , MD, [phone] Age: 82 Referring MD:  Date of Birth: 1941-07-22 Gender: Male Account #: [id number] Procedure:                Upper GI endoscopy Indications:              Follow-up of acute gastric ulcer Medicines:                Monitored Anesthesia Care Procedure:                Pre-Anesthesia Assessment:                           - Prior to the procedure, a History and Physical                            was performed, and patient medications and                            allergies were reviewed. The patient's tolerance of                            previous anesthesia was also reviewed. The risks                            and benefits of the procedure and the sedation                            options and risks were discussed with the patient.                            All questions were answered, and informed consent                            was obtained. Prior Anticoagulants: The patient has                            taken no anticoagulant or antiplatelet agents. ASA                            Grade Assessment: II - A patient with mild systemic                            disease. After reviewing the risks and benefits,                            the patient was deemed in satisfactory condition to                            undergo the procedure.                           After obtaining informed consent, the endoscope was  passed under direct vision. Throughout the                            procedure, the patient's blood pressure, pulse, and                            oxygen saturations were monitored continuously. The                            Olympus Scope [id number] was introduced through the                            mouth, and advanced to the second part of duodenum.                            The upper GI  endoscopy was accomplished without                            difficulty. The patient tolerated the procedure                            well. Scope In: Scope Out: Findings:                 One benign-appearing, intrinsic mild stenosis was                            found at the gastroesophageal junction. This                            stenosis measured 1.3 cm (inner diameter) x less                            than one cm (in length). The stenosis was traversed.                           The exam of the esophagus was otherwise normal.                           A medium-sized hiatal hernia was present.                           A single localized 3 mm erosion with no bleeding                            and no stigmata of recent bleeding was found on the                            posterior wall of the stomach. Biopsies were taken                            with a cold forceps for histology.  The exam of the stomach was otherwise normal.                           The duodenal bulb and second portion of the                            duodenum were normal. Complications:            No immediate complications. Estimated Blood Loss:     Estimated blood loss was minimal. Impression:               - Benign-appearing esophageal stenosis.                           - Medium-sized hiatal hernia.                           - Erosive gastropathy with no bleeding and no                            stigmata of recent bleeding. Biopsied.                           - Normal duodenal bulb and second portion of the                            duodenum. Recommendation:           - Patient has a contact number available for                            emergencies. The signs and symptoms of potential                            delayed complications were discussed with the                            patient. Return to normal activities tomorrow.                            Written discharge  instructions were provided to the                            patient.                           - Resume previous diet.                           - Continue present medications.                           - Await pathology results. Meryl Dare, MD 07/01/2023 2:09:05 PM This report has been signed electronically.

## 2023-07-01 NOTE — Progress Notes (Signed)
Called to room to assist during endoscopic procedure.  Patient ID and intended procedure confirmed with present staff. Received instructions for my participation in the procedure from the performing physician.  

## 2023-07-02 ENCOUNTER — Telehealth: Payer: Self-pay | Admitting: *Deleted

## 2023-07-02 NOTE — Telephone Encounter (Signed)
  Follow up Call-     07/01/2023   12:45 PM 04/06/2023   12:56 PM 08/07/2021   12:59 PM  Call back number  Post procedure Call Back phone  # [phone]  Permission to leave phone message Yes Yes Yes     Patient questions:  Do you have a fever, pain , or abdominal swelling? No. Pain Score  0 *  Have you tolerated food without any problems? Yes.    Have you been able to return to your normal activities? Yes.    Do you have any questions about your discharge instructions: Diet   No. Medications  No. Follow up visit  No.  Do you have questions or concerns about your Care? No.  Actions: * If pain score is 4 or above: No action needed, pain <4.

## 2023-07-03 ENCOUNTER — Other Ambulatory Visit: Payer: Self-pay | Admitting: *Deleted

## 2023-07-03 DIAGNOSIS — I6523 Occlusion and stenosis of bilateral carotid arteries: Secondary | ICD-10-CM

## 2023-07-07 ENCOUNTER — Encounter: Payer: Self-pay | Admitting: Gastroenterology

## 2023-07-13 ENCOUNTER — Ambulatory Visit (HOSPITAL_COMMUNITY)
Admission: RE | Admit: 2023-07-13 | Discharge: 2023-07-13 | Disposition: A | Discharge status: Home / Self Care | Payer: Medicare Other | Source: Ambulatory Visit | Attending: Surgery | Admitting: Surgery

## 2023-07-13 ENCOUNTER — Encounter: Payer: Self-pay | Admitting: Physician Assistant

## 2023-07-13 ENCOUNTER — Ambulatory Visit (INDEPENDENT_AMBULATORY_CARE_PROVIDER_SITE_OTHER): Payer: Medicare Other | Admitting: Physician Assistant

## 2023-07-13 VITALS — BP 131/90 | HR 73 | Temp 98.2°F | Resp 18 | Ht 67.0 in | Wt 140.0 lb

## 2023-07-13 DIAGNOSIS — I6523 Occlusion and stenosis of bilateral carotid arteries: Secondary | ICD-10-CM

## 2023-07-13 NOTE — Progress Notes (Signed)
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  He has a history of left TCAR in December 2022 by [name].  This was done for asymptomatic critical left ICA stenosis.  The patient returns today for follow up. He denies any recent strokelike symptoms such as slurred speech, facial droop, sudden visual changes, or sudden weakness/numbness.  He is still taking his daily aspirin and statin.  Current Outpatient Medications  Medication Sig Dispense Refill   albuterol (VENTOLIN HFA) 108 (90 Base) MCG/ACT inhaler Inhale 1-2 puffs into the lungs every 6 (six) hours as needed for shortness of breath or wheezing.     amoxicillin (AMOXIL) 500 MG capsule SMARTSIG:4 Capsule(s) By Mouth     aspirin 81 MG EC tablet Take 81 mg by mouth daily.     Calcium Carb-Cholecalciferol (CALCIUM 500 + D PO) Take by mouth.     Cholecalciferol (VITAMIN D3 PO) Take 1 tablet by mouth daily.     clobetasol cream (TEMOVATE) 0.05 % Apply topically.     clotrimazole (LOTRIMIN) 1 % external solution APPLY SMALL AMOUNT TO AFFECTED AREA TWICE A DAY APPLY TO FUNGAL TOENAILS     Docusate Sodium (DSS) 100 MG CAPS Take by mouth.     doxycycline (VIBRA-TABS) 100 MG tablet Take by mouth.     FEROSUL 325 (65 Fe) MG tablet Take by mouth.     fexofenadine (ALLEGRA) 180 MG tablet Take by mouth.     finasteride (PROSCAR) 5 MG tablet Take 5 mg by mouth daily.     folic acid (FOLVITE) 1 MG tablet Take 1 mg by mouth daily.     methotrexate (RHEUMATREX) 2.5 MG tablet Take 2.5 mg by mouth once a week.     Multiple Vitamins-Minerals (CENTRUM SILVER 50+MEN PO) Take 1 tablet by mouth daily.     Omega-3 1000 MG CAPS Take 1,000 mg by mouth daily.     omeprazole (PRILOSEC) 20 MG capsule Take 1 capsule (20 mg total) by mouth daily. 30 capsule 12   POTASSIUM PO Take 1 tablet by mouth daily.     REFRESH 1.4-0.6 % SOLN Apply to eye.      REFRESH PLUS 0.5 % SOLN Apply to eye.     rosuvastatin (CRESTOR) 10 MG tablet Take 1 tablet (10 mg total) by mouth daily. 30 tablet 12   Saw Palmetto, Serenoa repens, (SAW PALMETTO PO) Take 1 capsule by mouth daily.     sodium chloride (OCEAN) 0.65 % nasal spray Place into the nose.     tadalafil (CIALIS) 20 MG tablet Take 1 tablet by mouth as needed.     tamsulosin (FLOMAX) 0.4 MG CAPS capsule Take 0.4 mg by mouth daily.     triamcinolone ointment (KENALOG) 0.1 % Apply 1 application  topically daily as needed (itching).     alfuzosin (UROXATRAL) 10 MG 24 hr tablet Take 1 tablet by mouth daily. (Patient not taking: Reported on 07/01/2023)     fluticasone-salmeterol (ADVAIR) 100-50 MCG/ACT AEPB Inhale 1 puff into the lungs at bedtime.     No current facility-administered medications for this visit.    REVIEW OF SYSTEMS (negative unless checked):   Cardiac:  []  Chest pain or chest pressure? []  Shortness of breath upon activity? []  Shortness of breath when lying flat? []  Irregular heart rhythm?  Vascular:  []  Pain in calf, thigh, or hip brought on by  walking? []  Pain in feet at night that wakes you up from your sleep? []  Blood clot in your veins? []  Leg swelling?  Pulmonary:  []  Oxygen at home? []  Productive cough? []  Wheezing?  Neurologic:  []  Sudden weakness in arms or legs? []  Sudden numbness in arms or legs? []  Sudden onset of difficult speaking or slurred speech? []  Temporary loss of vision in one eye? []  Problems with dizziness?  Gastrointestinal:  []  Blood in stool? []  Vomited blood?  Genitourinary:  []  Burning when urinating? []  Blood in urine?  Psychiatric:  []  Major depression  Hematologic:  []  Bleeding problems? []  Problems with blood clotting?  Dermatologic:  []  Rashes or ulcers?  Constitutional:  []  Fever or chills?  Ear/Nose/Throat:  []  Change in hearing? []  Nose bleeds? []  Sore throat?  Musculoskeletal:  []  Back pain? []  Joint pain? []   Muscle pain?   Physical Examination   Vitals:   07/13/23 1041 07/13/23 1045  BP: 124/84 (!) 131/90  Pulse: 73   Resp: 18   Temp: 98.2 F (36.8 C)   TempSrc: Temporal   SpO2: 96%   Weight: 140 lb (63.5 kg)   Height: 5\' 7"  (1.702 m)    Body mass index is 21.93 kg/m.  General:  WDWN in NAD; vital signs documented above Gait: Not observed HENT: WNL, normocephalic Pulmonary: normal non-labored breathing , without rales, rhonchi,  wheezing Cardiac: regular without carotid bruit Abdomen: soft, NT, no masses Skin: without rashes Vascular Exam/Pulses: palpable radial pulses bilaterally Extremities: without ischemic changes, without gangrene , without cellulitis; without open wounds;  Musculoskeletal: no muscle wasting or atrophy  Neurologic: A&O X 3;  No focal weakness or paresthesias are detected Psychiatric:  The pt has Normal affect.  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 His left ICA stent is patent without stenosis. He denies any strokelike symptoms such as slurred speech, facial droop, sudden visual changes, or sudden weakness/numbness. He has no recent diagnosis of CVA or TIA. He has palpable and equal radial pulses bilaterally He should continue his daily aspirin and statin and follow up with our office in 1 year with carotid duplex   Loel Dubonnet PA-C [facility] Office: [phone]  Clinic MD: Myra Gianotti

## 2023-07-17 ENCOUNTER — Other Ambulatory Visit: Payer: Self-pay

## 2023-07-17 DIAGNOSIS — I6523 Occlusion and stenosis of bilateral carotid arteries: Secondary | ICD-10-CM

## 2024-07-15 ENCOUNTER — Other Ambulatory Visit: Payer: Self-pay

## 2024-07-15 DIAGNOSIS — I6523 Occlusion and stenosis of bilateral carotid arteries: Secondary | ICD-10-CM

## 2024-08-22 ENCOUNTER — Ambulatory Visit (INDEPENDENT_AMBULATORY_CARE_PROVIDER_SITE_OTHER): Admitting: Physician Assistant

## 2024-08-22 ENCOUNTER — Ambulatory Visit (HOSPITAL_COMMUNITY)
Admission: RE | Admit: 2024-08-22 | Discharge: 2024-08-22 | Disposition: A | Discharge status: Home / Self Care | Source: Ambulatory Visit | Attending: Surgery | Admitting: Surgery

## 2024-08-22 VITALS — BP 131/87 | HR 74 | Temp 98.1°F | Wt 140.0 lb

## 2024-08-22 DIAGNOSIS — I6523 Occlusion and stenosis of bilateral carotid arteries: Secondary | ICD-10-CM

## 2024-08-22 NOTE — Progress Notes (Signed)
 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  He has a history of left TCAR in December 2022 by Dr. Serene for asymptomatic critical left ICA stenosis.  He returns today for follow up. He is doing well without any complaints. He denies any recent strokelike symptoms such as slurred speech, facial droop, sudden visual changes, or sudden weakness/numbness.  He takes his daily aspirin  and statin.  Current Outpatient Medications  Medication Sig Dispense Refill   albuterol (VENTOLIN HFA) 108 (90 Base) MCG/ACT inhaler Inhale 1-2 puffs into the lungs every 6 (six) hours as needed for shortness of breath or wheezing.     alfuzosin (UROXATRAL) 10 MG 24 hr tablet Take 1 tablet by mouth daily. (Patient not taking: Reported on 07/01/2023)     amoxicillin (AMOXIL) 500 MG capsule SMARTSIG:4 Capsule(s) By Mouth     aspirin  81 MG EC tablet Take 81 mg by mouth daily.     Calcium  Carb-Cholecalciferol (CALCIUM  500 + D PO) Take by mouth.     Cholecalciferol (VITAMIN D3 PO) Take 1 tablet by mouth daily.     clobetasol cream (TEMOVATE) 0.05 % Apply topically.     Docusate Sodium  (DSS) 100 MG CAPS Take by mouth.     doxycycline (VIBRA-TABS) 100 MG tablet Take by mouth.     FEROSUL 325 (65 Fe) MG tablet Take by mouth.     fexofenadine (ALLEGRA) 180 MG tablet Take by mouth.     finasteride  (PROSCAR ) 5 MG tablet Take 5 mg by mouth daily.     fluticasone-salmeterol (ADVAIR) 100-50 MCG/ACT AEPB Inhale 1 puff into the lungs at bedtime.     folic acid (FOLVITE) 1 MG tablet Take 1 mg by mouth daily.     methotrexate  (RHEUMATREX) 2.5 MG tablet Take 2.5 mg by mouth once a week.     Multiple Vitamins-Minerals (CENTRUM SILVER 50+MEN PO) Take 1 tablet by mouth daily.     Omega-3 1000 MG CAPS Take 1,000 mg by mouth daily.     omeprazole  (PRILOSEC) 20 MG capsule Take 1 capsule (20 mg total) by mouth daily. 30 capsule  12   POTASSIUM PO Take 1 tablet by mouth daily.     REFRESH 1.4-0.6 % SOLN Apply to eye.     REFRESH PLUS 0.5 % SOLN Apply to eye.     rosuvastatin  (CRESTOR ) 10 MG tablet Take 1 tablet (10 mg total) by mouth daily. 30 tablet 12   Saw Palmetto, Serenoa repens, (SAW PALMETTO PO) Take 1 capsule by mouth daily.     sodium chloride  (OCEAN) 0.65 % nasal spray Place into the nose.     tadalafil (CIALIS) 20 MG tablet Take 1 tablet by mouth as needed.     tamsulosin  (FLOMAX ) 0.4 MG CAPS capsule Take 0.4 mg by mouth daily.     triamcinolone ointment (KENALOG) 0.1 % Apply 1 application  topically daily as needed (itching).     No current facility-administered medications for this visit.    REVIEW OF SYSTEMS (negative unless checked):   Cardiac:  []  Chest pain or chest pressure? []  Shortness of breath upon activity? []  Shortness of breath when lying flat? []  Irregular heart rhythm?  Vascular:  []  Pain in calf, thigh, or hip brought on by walking? []  Pain in feet at night that wakes you up from your sleep? []  Blood clot in your veins? []  Leg swelling?  Pulmonary:  []  Oxygen at home? []  Productive cough? []  Wheezing?  Neurologic:  []  Sudden weakness in arms or legs? []  Sudden numbness in arms or legs? []  Sudden onset of difficult speaking or slurred speech? []  Temporary loss of vision in one eye? []  Problems with dizziness?  Gastrointestinal:  []  Blood in stool? []  Vomited blood?  Genitourinary:  []  Burning when urinating? []  Blood in urine?  Psychiatric:  []  Major depression  Hematologic:  []  Bleeding problems? []  Problems with blood clotting?  Dermatologic:  []  Rashes or ulcers?  Constitutional:  []  Fever or chills?  Ear/Nose/Throat:  []  Change in hearing? []  Nose bleeds? []  Sore throat?  Musculoskeletal:  []  Back pain? []  Joint pain? []  Muscle pain?   Physical Examination   Vitals:   08/22/24 0847 08/22/24 0850  BP: 132/88 131/87  Pulse: 73 74  Temp:  98.1 F (36.7 C)   TempSrc: Temporal   Weight: 140 lb (63.5 kg)    There is no height or weight on file to calculate BMI.  General:  WDWN in NAD; vital signs documented above Gait: Not observed HENT: WNL, normocephalic Pulmonary: normal non-labored breathing  Cardiac: regular Abdomen: soft, NT, no masses Skin: without rashes Vascular Exam/Pulses: palpable radial pulses bilaterally Extremities: without ischemic changes, without gangrene , without cellulitis; without open wounds;  Musculoskeletal: no muscle wasting or atrophy  Neurologic: A&O X 3;  No focal weakness or paresthesias are detected Psychiatric:  The pt has Normal affect.  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  He has no significant stenosis in the right carotid artery.  His left carotid stent is patent without stenosis He denies any strokelike symptoms such as slurred speech, facial droop, sudden visual changes, or sudden weakness/numbness.  He has no neurological deficits on exam.  He has palpable and equal radial pulses bilaterally He will continue his daily aspirin  and statin and follow-up with our office in 1 year with repeat carotid duplex   Ahmed Holster PA-C [facility] Office: [phone]  Clinic MD: Serene

## 2024-08-31 NOTE — Assessment & Plan Note (Signed)
-   Continue baby aspirin  Continue Crestor  10 mg daily Lipid panel

## 2024-08-31 NOTE — Progress Notes (Signed)
 Cardiology Office Note:   Date:  08/31/2024  ID:  Adam Flores, DOB 01-08-1941, MRN [id number] PCP: Delana Corean GRADE, Flores   [facility] Providers Cardiologist:  Georganna Archer II { Chief Complaint:  Chief Complaint  Patient presents with   Follow-up      History of Present Illness:   Adam Flores is a 83 y.o. 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  Patient saw vascular on 08/22/2024 was doing well at that point.  Repeat carotid ultrasound showed no evidence of stenosis.  Today says that he feels really good and has no complaints.  Denies chest pain and SOB.  He is taking all of his medications as prescribed without side effect.  He is staying physically active exercising most days and eating a heart healthy diet.  His blood pressure was elevated today in clinic; however, he says he checks his blood pressure at home and is consistently in the 1 teens over 70.  He has no further questions or concerns.   Past Medical History:  Diagnosis Date   Allergy    Anemia    Asthma    BPH (benign prostatic hyperplasia)    Carotid stenosis    Colon polyps    GERD (gastroesophageal reflux disease)    IgA monoclonal gammopathy of uncertain significance    Prurigo nodularis    Right bundle branch block      Studies Reviewed:    EKG:  EKG Interpretation Date/Time:  Thursday September 01 2024 10:57:56 EST Ventricular Rate:  69 PR Interval:  174 QRS Duration:  144 QT Interval:  416 QTC Calculation: 445 R Axis:   21  Text Interpretation: Normal sinus rhythm with sinus arrhythmia Right bundle branch block When compared with ECG of 23-Apr-2023 10:26, No significant change since last tracing Confirmed by Archer Georganna [phone]) on 09/01/2024 10:59:31 AM         Risk Assessment/Calculations:           Physical Exam:     VS:  BP (!) 140/72   Pulse 69   Ht 5' 7 (1.702 m)   Wt 140 lb (63.5 kg)   SpO2 97%   BMI 21.93  kg/m      Wt Readings from Last 3 Encounters:  08/22/24 140 lb (63.5 kg)  07/13/23 140 lb (63.5 kg)  07/01/23 140 lb (63.5 kg)     GEN: Well nourished, well developed, in no acute distress NECK: No JVD; No carotid bruits CARDIAC: RRR, soft I/VI systolic murmur at LLSB, no rubs or gallops RESPIRATORY:  Clear to auscultation without rales, wheezing or rhonchi  ABDOMEN: Soft, non-tender, non-distended, normal bowel sounds EXTREMITIES:  Warm and well perfused, no edema; No deformity, 2+ radial pulses PSYCH: Normal mood and affect   Assessment & Plan Carotid stenosis, asymptomatic, left - Doing very well overall.  Follows with vascular surgery for his carotid stenosis that was repaired. -The patient shared his most recent lipid results with me that were performed by his PCP, but are not available in the chart.  He states that his LDL was in the 30s and triglycerides in the 40s. -No changes to his regiment. Continue baby aspirin  Continue Crestor  10 mg daily Follow-up in 12 months Heart murmur - Noted to have a slight heart murmur by physical exam. - Likely a flow murmur; however, we will check a complete echo particular since he does have an RBBB as well.  Complete echo      This note was written with the assistance of a dictation microphone or AI dictation software. Please excuse any typos or grammatical errors.   Signed, Georganna Archer, Flores 08/31/2024 11:33 AM     [facility]

## 2024-09-01 ENCOUNTER — Encounter: Payer: Self-pay | Admitting: Student in an Organized Health Care Education/Training Program

## 2024-09-01 ENCOUNTER — Ambulatory Visit
Attending: Student in an Organized Health Care Education/Training Program | Admitting: Student in an Organized Health Care Education/Training Program

## 2024-09-01 VITALS — BP 140/72 | HR 69 | Ht 67.0 in | Wt 140.0 lb

## 2024-09-01 DIAGNOSIS — I4510 Unspecified right bundle-branch block: Secondary | ICD-10-CM | POA: Diagnosis present

## 2024-09-01 DIAGNOSIS — R011 Cardiac murmur, unspecified: Secondary | ICD-10-CM | POA: Insufficient documentation

## 2024-09-01 DIAGNOSIS — I6522 Occlusion and stenosis of left carotid artery: Secondary | ICD-10-CM | POA: Insufficient documentation

## 2024-09-01 DIAGNOSIS — I6523 Occlusion and stenosis of bilateral carotid arteries: Secondary | ICD-10-CM | POA: Insufficient documentation

## 2024-09-01 NOTE — Patient Instructions (Signed)
 Medication Instructions:  Your physician recommends that you continue on your current medications as directed. Please refer to the Current Medication list given to you today.  *If you need a refill on your cardiac medications before your next appointment, please call your pharmacy*  Lab Work: NONE If you have labs (blood work) drawn today and your tests are completely normal, you will receive your results only by: MyChart Message (if you have MyChart) OR A paper copy in the mail If you have any lab test that is abnormal or we need to change your treatment, we will call you to review the results.  Testing/Procedures: Your physician has requested that you have an echocardiogram. Echocardiography is a painless test that uses sound waves to create images of your heart. It provides your doctor with information about the size and shape of your heart and how well your heart's chambers and valves are working. This procedure takes approximately one hour. There are no restrictions for this procedure. Please do NOT wear cologne, perfume, aftershave, or lotions (deodorant is allowed). Please arrive 15 minutes prior to your appointment time.  Please note: We ask at that you not bring children with you during ultrasound (echo/ vascular) testing. Due to room size and safety concerns, children are not allowed in the ultrasound rooms during exams. Our front office staff cannot provide observation of children in our lobby area while testing is being conducted. An adult accompanying a patient to their appointment will only be allowed in the ultrasound room at the discretion of the ultrasound technician under special circumstances. We apologize for any inconvenience.   Follow-Up: At [facility], you and your health needs are our priority.  As part of our continuing mission to provide you with exceptional heart care, our providers are all part of one team.  This team includes your primary Cardiologist  (physician) and Advanced Practice Providers or APPs (Physician Assistants and Nurse Practitioners) who all work together to provide you with the care you need, when you need it.  Your next appointment:   1 year(s)  Provider:   Dr Floretta  We recommend signing up for the patient portal called MyChart.  Sign up information is provided on this After Visit Summary.  MyChart is used to connect with patients for Virtual Visits (Telemedicine).  Patients are able to view lab/test results, encounter notes, upcoming appointments, etc.  Non-urgent messages can be sent to your provider as well.   To learn more about what you can do with MyChart, go to [email or web address].

## 2024-11-02 ENCOUNTER — Ambulatory Visit (HOSPITAL_COMMUNITY)
Admission: RE | Admit: 2024-11-02 | Discharge: 2024-11-02 | Disposition: A | Discharge status: Home / Self Care | Source: Ambulatory Visit | Attending: Internal Medicine | Admitting: Internal Medicine

## 2024-11-02 DIAGNOSIS — R011 Cardiac murmur, unspecified: Secondary | ICD-10-CM | POA: Insufficient documentation

## 2024-11-02 LAB — ECHOCARDIOGRAM COMPLETE
Area-P 1/2: 4.8 cm2
Calc EF: 55.5 %
S' Lateral: 3.3 cm
Single Plane A2C EF: 55.8 %
Single Plane A4C EF: 56 %

## 2024-11-03 ENCOUNTER — Ambulatory Visit: Payer: Self-pay | Admitting: Student in an Organized Health Care Education/Training Program

## 2024-11-03 NOTE — Progress Notes (Signed)
 Letter mailed

## 2024-12-01 ENCOUNTER — Telehealth: Payer: Self-pay | Admitting: Student in an Organized Health Care Education/Training Program

## 2024-12-01 NOTE — Telephone Encounter (Signed)
 Pts PCP office requesting Echo result to be faxed to [phone]
